# Patient Record
Sex: Female | Born: 1955 | Race: Black or African American | Hispanic: No | Smoking: Current every day smoker
Health system: Southern US, Community
[De-identification: ages and names within clinical notes are randomized; demographics above are authoritative.]

## PROBLEM LIST (undated history)

## (undated) DIAGNOSIS — M1990 Unspecified osteoarthritis, unspecified site: Secondary | ICD-10-CM

## (undated) DIAGNOSIS — T8859XA Other complications of anesthesia, initial encounter: Secondary | ICD-10-CM

## (undated) DIAGNOSIS — I6529 Occlusion and stenosis of unspecified carotid artery: Secondary | ICD-10-CM

## (undated) DIAGNOSIS — T4145XA Adverse effect of unspecified anesthetic, initial encounter: Secondary | ICD-10-CM

## (undated) DIAGNOSIS — R7303 Prediabetes: Secondary | ICD-10-CM

## (undated) DIAGNOSIS — K759 Inflammatory liver disease, unspecified: Secondary | ICD-10-CM

## (undated) DIAGNOSIS — I10 Essential (primary) hypertension: Secondary | ICD-10-CM

## (undated) HISTORY — DX: Occlusion and stenosis of unspecified carotid artery: I65.29

## (undated) HISTORY — PX: ABDOMINAL SURGERY: SHX537

---

## 1983-03-19 DIAGNOSIS — B1920 Unspecified viral hepatitis C without hepatic coma: Secondary | ICD-10-CM | POA: Insufficient documentation

## 1985-03-18 DIAGNOSIS — R159 Full incontinence of feces: Secondary | ICD-10-CM | POA: Insufficient documentation

## 2000-08-21 ENCOUNTER — Emergency Department (HOSPITAL_COMMUNITY): Admission: EM | Admit: 2000-08-21 | Discharge: 2000-08-21 | Payer: Self-pay | Admitting: Emergency Medicine

## 2001-07-01 ENCOUNTER — Emergency Department (HOSPITAL_COMMUNITY): Admission: EM | Admit: 2001-07-01 | Discharge: 2001-07-01 | Payer: Self-pay | Admitting: Internal Medicine

## 2004-02-05 ENCOUNTER — Emergency Department (HOSPITAL_COMMUNITY): Admission: EM | Admit: 2004-02-05 | Discharge: 2004-02-05 | Payer: Self-pay | Admitting: Emergency Medicine

## 2005-11-12 ENCOUNTER — Emergency Department (HOSPITAL_COMMUNITY): Admission: EM | Admit: 2005-11-12 | Discharge: 2005-11-12 | Payer: Self-pay | Admitting: Emergency Medicine

## 2007-01-10 ENCOUNTER — Emergency Department (HOSPITAL_COMMUNITY): Admission: EM | Admit: 2007-01-10 | Discharge: 2007-01-10 | Payer: Self-pay | Admitting: Emergency Medicine

## 2007-07-17 ENCOUNTER — Encounter: Payer: Self-pay | Admitting: Orthopedic Surgery

## 2007-07-17 ENCOUNTER — Emergency Department (HOSPITAL_COMMUNITY): Admission: EM | Admit: 2007-07-17 | Discharge: 2007-07-17 | Payer: Self-pay | Admitting: Emergency Medicine

## 2007-07-22 ENCOUNTER — Ambulatory Visit: Payer: Self-pay | Admitting: Orthopedic Surgery

## 2007-07-22 DIAGNOSIS — S92253B Displaced fracture of navicular [scaphoid] of unspecified foot, initial encounter for open fracture: Secondary | ICD-10-CM

## 2007-07-22 DIAGNOSIS — S92253A Displaced fracture of navicular [scaphoid] of unspecified foot, initial encounter for closed fracture: Secondary | ICD-10-CM | POA: Insufficient documentation

## 2007-07-26 ENCOUNTER — Emergency Department (HOSPITAL_COMMUNITY): Admission: EM | Admit: 2007-07-26 | Discharge: 2007-07-26 | Payer: Self-pay | Admitting: Emergency Medicine

## 2007-08-14 ENCOUNTER — Encounter: Payer: Self-pay | Admitting: Orthopedic Surgery

## 2007-08-24 ENCOUNTER — Ambulatory Visit: Payer: Self-pay | Admitting: Orthopedic Surgery

## 2007-12-03 ENCOUNTER — Emergency Department (HOSPITAL_COMMUNITY): Admission: EM | Admit: 2007-12-03 | Discharge: 2007-12-03 | Payer: Self-pay | Admitting: Emergency Medicine

## 2008-12-24 ENCOUNTER — Emergency Department (HOSPITAL_COMMUNITY): Admission: EM | Admit: 2008-12-24 | Discharge: 2008-12-24 | Payer: Self-pay | Admitting: Emergency Medicine

## 2009-06-10 ENCOUNTER — Emergency Department (HOSPITAL_COMMUNITY): Admission: EM | Admit: 2009-06-10 | Discharge: 2009-06-10 | Payer: Self-pay | Admitting: Emergency Medicine

## 2011-11-30 ENCOUNTER — Emergency Department (HOSPITAL_COMMUNITY): Payer: Self-pay

## 2011-11-30 ENCOUNTER — Encounter (HOSPITAL_COMMUNITY): Payer: Self-pay | Admitting: Emergency Medicine

## 2011-11-30 ENCOUNTER — Emergency Department (HOSPITAL_COMMUNITY)
Admission: EM | Admit: 2011-11-30 | Discharge: 2011-11-30 | Disposition: A | Discharge status: Home / Self Care | Payer: Self-pay | Attending: Emergency Medicine | Admitting: Emergency Medicine

## 2011-11-30 DIAGNOSIS — Z8673 Personal history of transient ischemic attack (TIA), and cerebral infarction without residual deficits: Secondary | ICD-10-CM | POA: Insufficient documentation

## 2011-11-30 DIAGNOSIS — I10 Essential (primary) hypertension: Secondary | ICD-10-CM

## 2011-11-30 DIAGNOSIS — S92309A Fracture of unspecified metatarsal bone(s), unspecified foot, initial encounter for closed fracture: Secondary | ICD-10-CM

## 2011-11-30 DIAGNOSIS — F17200 Nicotine dependence, unspecified, uncomplicated: Secondary | ICD-10-CM | POA: Insufficient documentation

## 2011-11-30 DIAGNOSIS — M79609 Pain in unspecified limb: Secondary | ICD-10-CM | POA: Insufficient documentation

## 2011-11-30 MED ORDER — OXYCODONE-ACETAMINOPHEN 5-325 MG PO TABS: 1.0000 | ORAL_TABLET | ORAL | Status: AC | PRN

## 2011-11-30 NOTE — ED Notes (Signed)
Pt stepped out of shoe this am and has had pain to L foot since. Anterior distal swelling noted. Nad. Has been ambulatory

## 2011-11-30 NOTE — ED Notes (Signed)
While attempting to re-take v/s, patient became very tearful.  Patient stated "alot had happen since April 2011".

## 2011-11-30 NOTE — ED Provider Notes (Signed)
History     CSN: [id number]  Arrival date & time 11/30/11  1335   First MD Initiated Contact with Patient 11/30/11 1353      Chief Complaint  Patient presents with  . Foot Pain    Patient is a 57 y.o. female presenting with lower extremity pain. The history is provided by the patient.  Foot Pain This is a new problem. Episode onset: earlier this morning. The problem occurs constantly. The problem has been gradually worsening. Pertinent negatives include no chest pain. The symptoms are aggravated by walking. The symptoms are relieved by rest. She has tried rest for the symptoms. The treatment provided mild relief.  pt reports she "stepped out of shoe" while dancing and hurt her foot.  No other injury is reported  Past Medical History  Diagnosis Date  . Stroke     Past Surgical History  Procedure Date  . Cesarean section   . Abdominal surgery     History reviewed. No pertinent family history.  History  Substance Use Topics  . Smoking status: Current Every Day Smoker  . Smokeless tobacco: Not on file  . Alcohol Use:      all alcohol daily    OB History    Grav Para Term Preterm Abortions TAB SAB Ect Mult Living                  Review of Systems  Cardiovascular: Negative for chest pain.  Neurological: Negative for weakness.    Allergies  Review of patient's allergies indicates no known allergies.  Home Medications  No current outpatient prescriptions on file.  BP 154/112  Pulse 104  Temp 98.6 F (37 C) (Oral)  Resp 18  Ht 5' 8.5" (1.74 m)  Wt 145 lb (65.772 kg)  BMI 21.73 kg/m2  SpO2 98%  Physical Exam CONSTITUTIONAL: Well developed/well nourished HEAD AND FACE: Normocephalic/atraumatic EYES: EOMI/PERRL ENMT: Mucous membranes moist NECK: supple no meningeal signs SPINE:entire spine nontender CV: S1/S2 noted, no murmurs/rubs/gallops noted LUNGS: Lungs are clear to auscultation bilaterally, no apparent distress ABDOMEN: soft, nontender, no rebound  or guarding GU:no cva tenderness NEURO: Pt is awake/alert, moves all extremitiesx4 EXTREMITIES: pulses normal, full ROM.  Tenderness to dorsal surface of left foot but no deformity or bruising noted.  No lacerations or puncture wounds noted to foot/toes.  She has full ROM of left ankle/knee and no tenderness noted SKIN: warm, color normal PSYCH: anxious ED Course  Procedures   3:00 PM Pt with nondisplaced fractures.  D/w dr Hilda Lias advise postop shoe and see early next week Advised need for f/u with PCP for BP control.  She has no other complaints at this time  MDM  Nursing notes including past medical history and social history reviewed and considered in documentation xrays reviewed and considered         Joya Gaskins, MD 11/30/11 1502

## 2014-12-28 ENCOUNTER — Inpatient Hospital Stay (HOSPITAL_COMMUNITY)
Admission: EM | Admit: 2014-12-28 | Discharge: 2014-12-31 | DRG: 872 | Disposition: A | Discharge status: Home / Self Care | Payer: Non-veteran care | Attending: Internal Medicine | Admitting: Internal Medicine

## 2014-12-28 ENCOUNTER — Other Ambulatory Visit: Payer: Self-pay

## 2014-12-28 ENCOUNTER — Other Ambulatory Visit: Payer: Self-pay | Admitting: Emergency Medicine

## 2014-12-28 ENCOUNTER — Emergency Department (HOSPITAL_COMMUNITY): Payer: Non-veteran care

## 2014-12-28 ENCOUNTER — Encounter (HOSPITAL_COMMUNITY): Payer: Self-pay | Admitting: *Deleted

## 2014-12-28 DIAGNOSIS — Z720 Tobacco use: Secondary | ICD-10-CM | POA: Diagnosis present

## 2014-12-28 DIAGNOSIS — F1010 Alcohol abuse, uncomplicated: Secondary | ICD-10-CM | POA: Diagnosis present

## 2014-12-28 DIAGNOSIS — J189 Pneumonia, unspecified organism: Secondary | ICD-10-CM

## 2014-12-28 DIAGNOSIS — E871 Hypo-osmolality and hyponatremia: Secondary | ICD-10-CM

## 2014-12-28 DIAGNOSIS — R7881 Bacteremia: Secondary | ICD-10-CM | POA: Diagnosis not present

## 2014-12-28 DIAGNOSIS — D509 Iron deficiency anemia, unspecified: Secondary | ICD-10-CM | POA: Diagnosis present

## 2014-12-28 DIAGNOSIS — R531 Weakness: Secondary | ICD-10-CM | POA: Diagnosis not present

## 2014-12-28 DIAGNOSIS — D539 Nutritional anemia, unspecified: Secondary | ICD-10-CM | POA: Diagnosis present

## 2014-12-28 DIAGNOSIS — A4151 Sepsis due to Escherichia coli [E. coli]: Secondary | ICD-10-CM | POA: Diagnosis not present

## 2014-12-28 DIAGNOSIS — F17200 Nicotine dependence, unspecified, uncomplicated: Secondary | ICD-10-CM | POA: Diagnosis present

## 2014-12-28 DIAGNOSIS — N12 Tubulo-interstitial nephritis, not specified as acute or chronic: Secondary | ICD-10-CM | POA: Diagnosis not present

## 2014-12-28 DIAGNOSIS — Z8673 Personal history of transient ischemic attack (TIA), and cerebral infarction without residual deficits: Secondary | ICD-10-CM

## 2014-12-28 DIAGNOSIS — E872 Acidosis: Secondary | ICD-10-CM | POA: Diagnosis present

## 2014-12-28 DIAGNOSIS — E876 Hypokalemia: Secondary | ICD-10-CM | POA: Diagnosis present

## 2014-12-28 DIAGNOSIS — E860 Dehydration: Secondary | ICD-10-CM | POA: Diagnosis present

## 2014-12-28 DIAGNOSIS — I10 Essential (primary) hypertension: Secondary | ICD-10-CM | POA: Diagnosis present

## 2014-12-28 DIAGNOSIS — A599 Trichomoniasis, unspecified: Secondary | ICD-10-CM | POA: Diagnosis present

## 2014-12-28 DIAGNOSIS — B9689 Other specified bacterial agents as the cause of diseases classified elsewhere: Secondary | ICD-10-CM | POA: Diagnosis present

## 2014-12-28 DIAGNOSIS — N10 Acute tubulo-interstitial nephritis: Secondary | ICD-10-CM | POA: Diagnosis present

## 2014-12-28 DIAGNOSIS — Z8249 Family history of ischemic heart disease and other diseases of the circulatory system: Secondary | ICD-10-CM

## 2014-12-28 DIAGNOSIS — B9620 Unspecified Escherichia coli [E. coli] as the cause of diseases classified elsewhere: Secondary | ICD-10-CM | POA: Diagnosis present

## 2014-12-28 LAB — CBC WITH DIFFERENTIAL/PLATELET
BASOS ABS: 0 10*3/uL (ref 0.0–0.1)
BASOS PCT: 0 %
EOS ABS: 0 10*3/uL (ref 0.0–0.7)
Eosinophils Relative: 0 %
HCT: 32.1 % — ABNORMAL LOW (ref 36.0–46.0)
HEMOGLOBIN: 11.5 g/dL — AB (ref 12.0–15.0)
Lymphocytes Relative: 3 %
Lymphs Abs: 0.8 10*3/uL (ref 0.7–4.0)
MCH: 36.4 pg — ABNORMAL HIGH (ref 26.0–34.0)
MCHC: 35.8 g/dL (ref 30.0–36.0)
MCV: 101.6 fL — ABNORMAL HIGH (ref 78.0–100.0)
Monocytes Absolute: 1.5 10*3/uL — ABNORMAL HIGH (ref 0.1–1.0)
Monocytes Relative: 7 %
NEUTROS PCT: 90 %
Neutro Abs: 21 10*3/uL — ABNORMAL HIGH (ref 1.7–7.7)
Platelets: 191 10*3/uL (ref 150–400)
RBC: 3.16 MIL/uL — AB (ref 3.87–5.11)
RDW: 13 % (ref 11.5–15.5)
WBC: 23.2 10*3/uL — AB (ref 4.0–10.5)

## 2014-12-28 LAB — FOLATE: Folate: 18.5 ng/mL (ref >5.9)

## 2014-12-28 LAB — URINALYSIS, ROUTINE W REFLEX MICROSCOPIC
BILIRUBIN URINE: NEGATIVE
GLUCOSE, UA: NEGATIVE mg/dL
Ketones, ur: NEGATIVE mg/dL
NITRITE: POSITIVE — AB
PH: 6.5 (ref 5.0–8.0)
SPECIFIC GRAVITY, URINE: 1.015 (ref 1.005–1.030)
Urobilinogen, UA: 0.2 mg/dL (ref 0.0–1.0)

## 2014-12-28 LAB — COMPREHENSIVE METABOLIC PANEL
ALK PHOS: 87 U/L (ref 38–126)
ALT: 19 U/L (ref 14–54)
AST: 26 U/L (ref 15–41)
Albumin: 2.9 g/dL — ABNORMAL LOW (ref 3.5–5.0)
Anion gap: 7 (ref 5–15)
BUN: 13 mg/dL (ref 6–20)
CALCIUM: 8.1 mg/dL — AB (ref 8.9–10.3)
CHLORIDE: 103 mmol/L (ref 101–111)
CO2: 20 mmol/L — ABNORMAL LOW (ref 22–32)
CREATININE: 0.8 mg/dL (ref 0.44–1.00)
Glucose, Bld: 126 mg/dL — ABNORMAL HIGH (ref 65–99)
Potassium: 2.9 mmol/L — ABNORMAL LOW (ref 3.5–5.1)
Sodium: 130 mmol/L — ABNORMAL LOW (ref 135–145)
TOTAL PROTEIN: 6.4 g/dL — AB (ref 6.5–8.1)
Total Bilirubin: 0.6 mg/dL (ref 0.3–1.2)

## 2014-12-28 LAB — RETICULOCYTES
RBC.: 3.21 MIL/uL — ABNORMAL LOW (ref 3.87–5.11)
RETIC COUNT ABSOLUTE: 61 10*3/uL (ref 19.0–186.0)
Retic Ct Pct: 1.9 % (ref 0.4–3.1)

## 2014-12-28 LAB — IRON AND TIBC
IRON: 6 ug/dL — AB (ref 28–170)
SATURATION RATIOS: 2 % — AB (ref 10.4–31.8)
TIBC: 244 ug/dL — ABNORMAL LOW (ref 250–450)
UIBC: 238 ug/dL

## 2014-12-28 LAB — VITAMIN B12: Vitamin B-12: 526 pg/mL (ref 180–914)

## 2014-12-28 LAB — URINE MICROSCOPIC-ADD ON

## 2014-12-28 LAB — MAGNESIUM: MAGNESIUM: 1.7 mg/dL (ref 1.7–2.4)

## 2014-12-28 LAB — FERRITIN: FERRITIN: 475 ng/mL — AB (ref 11–307)

## 2014-12-28 LAB — TSH: TSH: 0.296 u[IU]/mL — ABNORMAL LOW (ref 0.350–4.500)

## 2014-12-28 LAB — LACTIC ACID, PLASMA: LACTIC ACID, VENOUS: 1.3 mmol/L (ref 0.5–2.0)

## 2014-12-28 MED ORDER — SODIUM CHLORIDE 0.9 % IV BOLUS (SEPSIS)
1000.0000 mL | Freq: Once | INTRAVENOUS | Status: AC
  Administered 2014-12-28: 1000 mL via INTRAVENOUS

## 2014-12-28 MED ORDER — FOLIC ACID 1 MG PO TABS
1.0000 mg | ORAL_TABLET | Freq: Every day | ORAL | Status: DC
  Administered 2014-12-28 – 2014-12-31 (×4): 1 mg via ORAL
  Filled 2014-12-28 (×4): qty 1

## 2014-12-28 MED ORDER — ONDANSETRON HCL 4 MG PO TABS: 4.0000 mg | ORAL_TABLET | Freq: Four times a day (QID) | ORAL | Status: DC | PRN

## 2014-12-28 MED ORDER — IOHEXOL 300 MG/ML  SOLN
50.0000 mL | Freq: Once | INTRAMUSCULAR | Status: AC | PRN
  Administered 2014-12-28: 50 mL via ORAL

## 2014-12-28 MED ORDER — LISINOPRIL 10 MG PO TABS
10.0000 mg | ORAL_TABLET | Freq: Every day | ORAL | Status: DC
  Administered 2014-12-28 – 2014-12-29 (×2): 10 mg via ORAL
  Filled 2014-12-28 (×2): qty 1

## 2014-12-28 MED ORDER — SODIUM CHLORIDE 0.9 % IV SOLN
Freq: Once | INTRAVENOUS | Status: AC
  Administered 2014-12-28: 06:00:00 via INTRAVENOUS

## 2014-12-28 MED ORDER — SENNOSIDES-DOCUSATE SODIUM 8.6-50 MG PO TABS
1.0000 | ORAL_TABLET | Freq: Every evening | ORAL | Status: DC | PRN
  Filled 2014-12-28: qty 1

## 2014-12-28 MED ORDER — POTASSIUM CHLORIDE IN NACL 20-0.9 MEQ/L-% IV SOLN
INTRAVENOUS | Status: DC
  Administered 2014-12-28 – 2014-12-29 (×2): via INTRAVENOUS

## 2014-12-28 MED ORDER — ONDANSETRON HCL 4 MG/2ML IJ SOLN
4.0000 mg | Freq: Once | INTRAMUSCULAR | Status: AC
  Administered 2014-12-28: 4 mg via INTRAVENOUS

## 2014-12-28 MED ORDER — ACETAMINOPHEN 325 MG PO TABS
650.0000 mg | ORAL_TABLET | Freq: Four times a day (QID) | ORAL | Status: DC | PRN
Start: 2014-12-28 — End: 2014-12-31
  Administered 2014-12-28 – 2014-12-30 (×4): 650 mg via ORAL
  Filled 2014-12-28 (×4): qty 2

## 2014-12-28 MED ORDER — THIAMINE HCL 100 MG/ML IJ SOLN: 100.0000 mg | Freq: Every day | INTRAMUSCULAR | Status: DC

## 2014-12-28 MED ORDER — POTASSIUM CHLORIDE CRYS ER 20 MEQ PO TBCR
30.0000 meq | EXTENDED_RELEASE_TABLET | Freq: Three times a day (TID) | ORAL | Status: AC
  Administered 2014-12-28 – 2014-12-29 (×3): 30 meq via ORAL
  Filled 2014-12-28 (×6): qty 1

## 2014-12-28 MED ORDER — IBUPROFEN 600 MG PO TABS
600.0000 mg | ORAL_TABLET | Freq: Three times a day (TID) | ORAL | Status: DC | PRN
  Administered 2014-12-28 – 2014-12-29 (×2): 600 mg via ORAL
  Filled 2014-12-28 (×2): qty 1

## 2014-12-28 MED ORDER — DEXTROSE 5 % IV SOLN: 1.0000 g | INTRAVENOUS | Status: DC

## 2014-12-28 MED ORDER — METRONIDAZOLE 500 MG PO TABS
2000.0000 mg | ORAL_TABLET | Freq: Once | ORAL | Status: AC
  Administered 2014-12-28: 2000 mg via ORAL
  Filled 2014-12-28: qty 4

## 2014-12-28 MED ORDER — VITAMIN B-1 100 MG PO TABS
100.0000 mg | ORAL_TABLET | Freq: Every day | ORAL | Status: DC
  Administered 2014-12-28 – 2014-12-31 (×4): 100 mg via ORAL
  Filled 2014-12-28 (×4): qty 1

## 2014-12-28 MED ORDER — ALUM & MAG HYDROXIDE-SIMETH 200-200-20 MG/5ML PO SUSP: 30.0000 mL | Freq: Four times a day (QID) | ORAL | Status: DC | PRN

## 2014-12-28 MED ORDER — ADULT MULTIVITAMIN W/MINERALS CH
1.0000 | ORAL_TABLET | Freq: Every day | ORAL | Status: DC
  Administered 2014-12-28 – 2014-12-31 (×4): 1 via ORAL
  Filled 2014-12-28 (×4): qty 1

## 2014-12-28 MED ORDER — IOHEXOL 300 MG/ML  SOLN
100.0000 mL | Freq: Once | INTRAMUSCULAR | Status: DC | PRN
  Administered 2014-12-28: 100 mL via INTRAVENOUS
  Filled 2014-12-28: qty 100

## 2014-12-28 MED ORDER — LORAZEPAM 2 MG/ML IJ SOLN: 1.0000 mg | Freq: Four times a day (QID) | INTRAMUSCULAR | Status: AC | PRN

## 2014-12-28 MED ORDER — THIAMINE HCL 100 MG/ML IJ SOLN
100.0000 mg | Freq: Once | INTRAMUSCULAR | Status: AC
  Administered 2014-12-28: 100 mg via INTRAVENOUS

## 2014-12-28 MED ORDER — DEXTROSE 5 % IV SOLN
1.0000 g | INTRAVENOUS | Status: DC
  Filled 2014-12-28: qty 10

## 2014-12-28 MED ORDER — POTASSIUM CHLORIDE IN NACL 40-0.9 MEQ/L-% IV SOLN
INTRAVENOUS | Status: DC
  Administered 2014-12-28: 125 mL/h via INTRAVENOUS
  Filled 2014-12-28: qty 1000

## 2014-12-28 MED ORDER — ACETAMINOPHEN 650 MG RE SUPP
650.0000 mg | Freq: Four times a day (QID) | RECTAL | Status: DC | PRN
Start: 2014-12-28 — End: 2014-12-31

## 2014-12-28 MED ORDER — LORAZEPAM 1 MG PO TABS
1.0000 mg | ORAL_TABLET | Freq: Four times a day (QID) | ORAL | Status: AC | PRN
  Administered 2014-12-29 – 2014-12-30 (×2): 1 mg via ORAL
  Filled 2014-12-28 (×2): qty 1

## 2014-12-28 MED ORDER — HYDROCHLOROTHIAZIDE 25 MG PO TABS
25.0000 mg | ORAL_TABLET | Freq: Every day | ORAL | Status: DC
  Administered 2014-12-28: 25 mg via ORAL
  Filled 2014-12-28: qty 1

## 2014-12-28 MED ORDER — DEXTROSE 5 % IV SOLN
2.0000 g | Freq: Once | INTRAVENOUS | Status: AC
  Administered 2014-12-28: 2 g via INTRAVENOUS
  Filled 2014-12-28: qty 2

## 2014-12-28 MED ORDER — ONDANSETRON HCL 4 MG/2ML IJ SOLN: 4.0000 mg | Freq: Once | INTRAMUSCULAR | Status: DC

## 2014-12-28 MED ORDER — AMLODIPINE BESYLATE 5 MG PO TABS
10.0000 mg | ORAL_TABLET | Freq: Every day | ORAL | Status: DC
  Administered 2014-12-28 – 2014-12-29 (×2): 10 mg via ORAL
  Filled 2014-12-28 (×2): qty 2

## 2014-12-28 MED ORDER — ENOXAPARIN SODIUM 40 MG/0.4ML ~~LOC~~ SOLN
40.0000 mg | SUBCUTANEOUS | Status: DC
  Filled 2014-12-28: qty 0.4

## 2014-12-28 MED ORDER — SODIUM CHLORIDE 0.9 % IV BOLUS (SEPSIS)
2000.0000 mL | Freq: Once | INTRAVENOUS | Status: AC
  Administered 2014-12-28: 2000 mL via INTRAVENOUS

## 2014-12-28 MED ORDER — CYCLOBENZAPRINE HCL 10 MG PO TABS
5.0000 mg | ORAL_TABLET | Freq: Two times a day (BID) | ORAL | Status: DC | PRN
  Administered 2014-12-29: 5 mg via ORAL
  Filled 2014-12-28 (×2): qty 1

## 2014-12-28 MED ORDER — LEVOFLOXACIN IN D5W 750 MG/150ML IV SOLN
750.0000 mg | INTRAVENOUS | Status: DC
  Administered 2014-12-28 – 2014-12-31 (×4): 750 mg via INTRAVENOUS
  Filled 2014-12-28 (×4): qty 150

## 2014-12-28 MED ORDER — ENOXAPARIN SODIUM 40 MG/0.4ML ~~LOC~~ SOLN
40.0000 mg | SUBCUTANEOUS | Status: DC
  Administered 2014-12-28 – 2014-12-31 (×4): 40 mg via SUBCUTANEOUS
  Filled 2014-12-28 (×3): qty 0.4

## 2014-12-28 MED ORDER — ONDANSETRON HCL 4 MG/2ML IJ SOLN
4.0000 mg | Freq: Four times a day (QID) | INTRAMUSCULAR | Status: DC | PRN
  Administered 2014-12-28: 4 mg via INTRAVENOUS
  Filled 2014-12-28: qty 2

## 2014-12-28 MED ORDER — NICOTINE 21 MG/24HR TD PT24
21.0000 mg | MEDICATED_PATCH | Freq: Every day | TRANSDERMAL | Status: DC
  Administered 2014-12-28 – 2014-12-31 (×4): 21 mg via TRANSDERMAL
  Filled 2014-12-28 (×4): qty 1

## 2014-12-28 NOTE — ED Notes (Signed)
Pt states that she is "slowly feeling better".

## 2014-12-28 NOTE — H&P (Signed)
History and Physical  Paula Gomez ZOX:[id number] DOB: Jul 27, 1955 DOA: 12/28/2014  Referring physician: Dr Wilkie Aye, ED physician PCP: No primary care provider on file.   Chief Complaint: Left flank pain, weakness  HPI: Paula Gomez is a 59 y.o. female  With a history of hypertension who is seen for 48 hours of weakness and left flank pain just been worsening over the past 24 hours. The patient went on a bender this past weekend drinking a pint of vodka each day. She is a chronic alcoholic who drinks 3-4 beers a day. Since Monday, her symptoms of worsening. She presented to the hospital due to weakness that worsened with elevation and movement and improved with lying down in bed. She denies fevers, chills.    Review of Systems:   Pt complains of diminished appetite.  Pt denies any fevers, chills, nausea, vomiting, diarrhea, constipation, abdominal pain, shortness of breath, dyspnea on exertion, orthopnea, cough, wheezing, palpitations, headache, vision changes, lightheadedness, dizziness, diarrhea, constipation, melena, rectal bleeding.  Review of systems are otherwise negative  Past Medical History  Diagnosis Date  . Stroke [facility])    Past Surgical History  Procedure Laterality Date  . Cesarean section    . Abdominal surgery     Social History:  reports that she has been smoking.  She does not have any smokeless tobacco history on file. She reports that she drinks alcohol. She reports that she uses illicit drugs (Marijuana and Cocaine). Patient lives at home & is able to participate in activities of daily living  No Known Allergies  Family history of hypertension  Prior to Admission medications   Medication Sig Start Date End Date Taking? Authorizing Provider  amLODipine (NORVASC) 10 MG tablet Take 10 mg by mouth daily.   Yes Historical Provider, MD  cyclobenzaprine (FLEXERIL) 10 MG tablet Take 5 mg by mouth 2 (two) times daily as needed for muscle spasms.   Yes Historical  Provider, MD  hydrochlorothiazide (HYDRODIURIL) 25 MG tablet Take 25 mg by mouth daily.   Yes Historical Provider, MD  ibuprofen (ADVIL,MOTRIN) 600 MG tablet Take 600 mg by mouth 3 (three) times daily as needed for mild pain.   Yes Historical Provider, MD  lisinopril (PRINIVIL,ZESTRIL) 20 MG tablet Take 10 mg by mouth daily.   Yes Historical Provider, MD    Physical Exam: BP 128/86 mmHg  Pulse 111  Temp(Src) 97.7 F (36.5 C)  Resp 20  SpO2 95%  General: Middle-aged black female. Awake and alert and oriented x3. No acute cardiopulmonary distress.  Eyes: Pupils equal, round, reactive to light. Extraocular muscles are intact. Sclerae anicteric and noninjected.  ENT: Moist mucosal membranes. No mucosal lesions. Teeth in moderate repair  Neck: Neck supple without lymphadenopathy. No carotid bruits. No masses palpated.  Cardiovascular: Regular rate with normal S1-S2 sounds. No murmurs, rubs, gallops auscultated. No JVD.  Respiratory: Good respiratory effort with no wheezes, rales, rhonchi. Lungs clear to auscultation bilaterally.  Abdomen: Soft, nontender, nondistended. Active bowel sounds. No masses or hepatosplenomegaly  Skin: Dry, warm to touch. 2+ dorsalis pedis and radial pulses. Musculoskeletal: No calf or leg pain. All major joints not erythematous nontender. CVA tenderness left greater than right Psychiatric: Intact judgment and insight.  Neurologic: No focal neurological deficits. Cranial nerves II through XII are grossly intact.           Labs on Admission:  Due to downtime, the patient's labs have not been entered into the electronic medical record. Chemistry: Sodium 127, potassium  3.0, chloride 96, I can't 18, BUN 25, creatinine 1.09, glucose 155. LFTs: Alkaline phosphatase 105, AST 27, ALT 23, albumin 3.8, total bili 0.9  Lipase 16  Complete blood count: WBC 26.5, hemoglobin 14.0, hematocrit 38.5, platelets 244 Differential: Neutrophils 87% with absolute granulocyte  23.1  Radiological Exams on Admission: Ct Abdomen Pelvis W Contrast  12/28/2014  CLINICAL DATA:  Left-sided abdominal pain and vomiting. Weakness. Urinary tract infection. EXAM: CT ABDOMEN AND PELVIS WITH CONTRAST TECHNIQUE: Multidetector CT imaging of the abdomen and pelvis was performed using the standard protocol following bolus administration of intravenous contrast. CONTRAST:  50mL OMNIPAQUE IOHEXOL 300 MG/ML SOLN, 100mL OMNIPAQUE IOHEXOL 300 MG/ML SOLN COMPARISON:  CT pelvis 06/10/2009 FINDINGS: Patchy infiltration in the lung bases is nonspecific but could indicate edema or pneumonia. The liver, spleen, gallbladder, pancreas, adrenal glands, inferior vena cava, and retroperitoneal lymph nodes are unremarkable. Calcification of abdominal aorta without aneurysm. 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 Heterogeneous nephrogram to both kidneys but more prominent on the left kidney. Left kidney is diffusely enlarged with a focal a zones of low attenuation. Appearance is consistent with pyelonephritis. No abscess identified. No hydronephrosis. Mild stranding around the left kidney. The stomach, small bowel, and colon appear normal. No free air or free fluid in the abdomen. Abdominal wall musculature appears intact. Pelvis: The appendix is normal. Uterus and ovaries are not enlarged. Surgical clips consistent with tubal ligations. Bladder wall is not thickened. No free or loculated pelvic fluid collections. No pelvic mass or lymphadenopathy. Degenerative changes in the spine. No destructive bone lesions. IMPRESSION: Changes of bilateral pyelonephritis, greater on the left. Patchy airspace disease in the lung bases may indicate edema or pneumonia. Electronically Signed   By: Burman NievesWilliam  Stevens M.D.   On: 12/28/2014 05:13    EKG: Independently reviewed. Sinus tachycardia with ventricular rate of 109. Normal intervals.  Assessment/Plan Present on Admission:  . Pyelonephritis . Hyponatremia .  Hypokalemia  This patient was discussed with the ED physician, including pertinent vitals, physical exam findings, labs, and imaging.  We also discussed care given by the ED provider.  #1 bilateral pyelonephritis  Admits  Given the patient's electrolyte abnormalities, extremely elevated white blood cell count, patient admitted  Continue Rocephin  Urine culture pending  Blood cultures pending  IV fluids normal saline with 40 mEq potassium at 125 mL per hour 2 hyponatremia  IV fluids at 125 animals per hour #3 hypokalemia  Replace potassium  Recheck metabolic panel tomorrow  DVT prophylaxis: Lovenox  Consultants: None  Code Status: Full code  Family Communication: None   Disposition Plan: Admission   Levie HeritageJacob J Stinson, DO [facility] Pager [phone]

## 2014-12-28 NOTE — Progress Notes (Signed)
The patient is a 59 year old with a history of a stroke, alcohol abuse, and tobacco abuse, who was admitted this morning by Dr. Adrian BlackwaterStinson for acute polynephritis. She was briefly seen and examined. Her chart, vital signs, and laboratory studies were reviewed. Agree with current management with changes/additions below:  -We'll discontinue hydrochlorothiazide. -We'll add potassium to the IV fluids. Will order oral potassium for repletion. -We'll discontinue ceftriaxone and start Levaquin to cover both polynephritis and possible pneumonia. (2 g Metronidazole given in the ED for trichomonas in the urine). -We will order a two-view chest x-ray tomorrow morning. -We'll order an anemia panel and HIV serology. -We'll consult social worker regarding alcohol abuse. Will order tobacco cessation counseling. We'll add a nicotine patch.

## 2014-12-28 NOTE — ED Provider Notes (Addendum)
CSN: [id number]     Arrival date & time 12/28/14  0241 History   First MD Initiated Contact with Patient 12/28/14 0249     Chief Complaint  Patient presents with  . Weakness     (Consider location/radiation/quality/duration/timing/severity/associated sxs/prior Treatment) HPI  This a 59 year old female who presents with nausea, vomiting, and weakness. Reports chills. No fevers. Patient reports that she feels like she drank too much over the weekend. She has a history of daily drinking. She has not had anything to drink since Sunday when she drank a pint of vodka. Reports diaphoresis. Reports feeling off balance, no room spinning dizziness. She reports nonbilious, nonbloody emesis. Denies abdominal pain.  Denies urinary symptoms. Does endorse left flank pain.  Past Medical History  Diagnosis Date  . Stroke [facility])    Past Surgical History  Procedure Laterality Date  . Cesarean section    . Abdominal surgery     History reviewed. No pertinent family history. Social History  Substance Use Topics  . Smoking status: Current Every Day Smoker  . Smokeless tobacco: None  . Alcohol Use: Yes     Comment: all alcohol daily   OB History    No data available     Review of Systems  Constitutional: Positive for chills. Negative for fever.  Respiratory: Negative for cough, chest tightness and shortness of breath.   Cardiovascular: Negative for chest pain.  Gastrointestinal: Positive for nausea and vomiting. Negative for abdominal pain.  Genitourinary: Positive for flank pain. Negative for dysuria and hematuria.  Musculoskeletal: Negative for back pain.  Neurological: Positive for dizziness. Negative for headaches.  Psychiatric/Behavioral: Negative for confusion.  All other systems reviewed and are negative.     Allergies  Review of patient's allergies indicates no known allergies.  Home Medications   Prior to Admission medications   Not on File   BP 128/86 mmHg  Pulse 111   Temp(Src) 97.7 F (36.5 C)  Resp 20  SpO2 95% Physical Exam  Constitutional: She is oriented to person, place, and time. No distress.  Ill-appearing but nontoxic, no acute distress  HENT:  Head: Normocephalic and atraumatic.  Mucous membranes dry  Cardiovascular: Regular rhythm and normal heart sounds.   No murmur heard. Tachycardia  Pulmonary/Chest: Effort normal and breath sounds normal. No respiratory distress. She has no wheezes.  Abdominal: Soft. Bowel sounds are normal. There is no tenderness. There is no rebound.  Genitourinary:  Left CVA tenderness  Neurological: She is alert and oriented to person, place, and time.  Cranial nerves II through XII intact, 5 out of 5 strength in all 4 extremities, no dysmetria to finger-nose-finger  Skin: Skin is warm and dry.  Psychiatric: She has a normal mood and affect.  Nursing note and vitals reviewed.   ED Course  Procedures (including critical care time) Labs Review Labs Reviewed  URINALYSIS, ROUTINE W REFLEX MICROSCOPIC (NOT AT [facility]) - Abnormal; Notable for the following:    Hgb urine dipstick TRACE (*)    Protein, ur TRACE (*)    Nitrite POSITIVE (*)    Leukocytes, UA SMALL (*)    All other components within normal limits  URINE MICROSCOPIC-ADD ON - Abnormal; Notable for the following:    Squamous Epithelial / LPF FEW (*)    Bacteria, UA MANY (*)    All other components within normal limits  CULTURE, BLOOD (ROUTINE X 2)  CULTURE, BLOOD (ROUTINE X 2)  URINE CULTURE  CBC WITH DIFFERENTIAL/PLATELET  LACTIC ACID, PLASMA  LACTIC ACID, PLASMA    Imaging Review Ct Abdomen Pelvis W Contrast  12/28/2014  CLINICAL DATA:  Left-sided abdominal pain and vomiting. Weakness. Urinary tract infection. EXAM: CT ABDOMEN AND PELVIS WITH CONTRAST TECHNIQUE: Multidetector CT imaging of the abdomen and pelvis was performed using the standard protocol following bolus administration of intravenous contrast. CONTRAST:  50mL OMNIPAQUE IOHEXOL  300 MG/ML SOLN, 100mL OMNIPAQUE IOHEXOL 300 MG/ML SOLN COMPARISON:  CT pelvis 06/10/2009 FINDINGS: Patchy infiltration in the lung bases is nonspecific but could indicate edema or pneumonia. The liver, spleen, gallbladder, pancreas, adrenal glands, inferior vena cava, and retroperitoneal lymph nodes are unremarkable. Calcification of abdominal aorta without aneurysm. 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 Heterogeneous nephrogram to both kidneys but more prominent on the left kidney. Left kidney is diffusely enlarged with a focal a zones of low attenuation. Appearance is consistent with pyelonephritis. No abscess identified. No hydronephrosis. Mild stranding around the left kidney. The stomach, small bowel, and colon appear normal. No free air or free fluid in the abdomen. Abdominal wall musculature appears intact. Pelvis: The appendix is normal. Uterus and ovaries are not enlarged. Surgical clips consistent with tubal ligations. Bladder wall is not thickened. No free or loculated pelvic fluid collections. No pelvic mass or lymphadenopathy. Degenerative changes in the spine. No destructive bone lesions. IMPRESSION: Changes of bilateral pyelonephritis, greater on the left. Patchy airspace disease in the lung bases may indicate edema or pneumonia. Electronically Signed   By: Burman NievesWilliam  Stevens M.D.   On: 12/28/2014 05:13   I have personally reviewed and evaluated these images and lab results as part of my medical decision-making.   EKG Interpretation ED ECG REPORT   Date: 12/28/2014  Rate: 109  Rhythm: sinus tachycardia  QRS Axis: normal  Intervals: normal  ST/T Wave abnormalities: normal  Conduction Disutrbances:none  Narrative Interpretation:          MDM   Final diagnoses:  Pyelonephritis  Hyponatremia    Patient presents with nausea, vomiting, dizziness, and weakness. Feels this is related to alcohol use. Ill-appearing but nontoxic on exam. Mucous membranes dry and patient  is tachycardic.  Patient given fluids. She is afebrile. Lab work obtained including urinalysis. Lab work notable for white blood count of 26, sodium of 127. Urine is nitrite positive with 7-10 white cells and many bacteria. Trichomonas is also present. Urine culture sent. Blood culture sent and patient given 2 g of Rocephin. She was also given 2 g of Flagyl for trichomonas. Given systemic symptoms and chills as well as left flank pain, would be concerned for pyelonephritis. CT obtained to rule out perinephric abscess. CT with evidence of bilateral polynephritis, left greater than right. On recheck, has received 2 L of fluid. She reports that she feels much better. She has no active emesis. Given evidence of bilateral pyelonephritis, leukocytosis, and hyponatremia, will admit for IV antibiotics and continued fluid administered.  Discussed with Dr. Adrian BlackwaterStinson.     Shon Batonourtney F Horton, MD 12/28/14 [id number]  Shon Batonourtney F Horton, MD 12/28/14 0600

## 2014-12-28 NOTE — Progress Notes (Signed)
Lab called with blood culture results showing gram neg rods, MD made aware.

## 2014-12-28 NOTE — ED Notes (Signed)
Pt ambulated to BR with minimal assistance.  Reporting that she is feeling some better.  No nausea or vomiting noted at present.

## 2014-12-28 NOTE — ED Notes (Signed)
Pt reporting weakness, and vomiting that began about 2 hours ago.  Pt states "i had too much to drink over the weekend."  Denies ETOH since Sunday.

## 2014-12-29 ENCOUNTER — Inpatient Hospital Stay (HOSPITAL_COMMUNITY): Payer: Non-veteran care

## 2014-12-29 DIAGNOSIS — Z720 Tobacco use: Secondary | ICD-10-CM

## 2014-12-29 LAB — CBC
HCT: 29.6 % — ABNORMAL LOW (ref 36.0–46.0)
HEMOGLOBIN: 10.6 g/dL — AB (ref 12.0–15.0)
MCH: 35.9 pg — ABNORMAL HIGH (ref 26.0–34.0)
MCHC: 35.8 g/dL (ref 30.0–36.0)
MCV: 100.3 fL — ABNORMAL HIGH (ref 78.0–100.0)
PLATELETS: 179 10*3/uL (ref 150–400)
RBC: 2.95 MIL/uL — AB (ref 3.87–5.11)
RDW: 12.9 % (ref 11.5–15.5)
WBC: 15.4 10*3/uL — ABNORMAL HIGH (ref 4.0–10.5)

## 2014-12-29 LAB — BASIC METABOLIC PANEL
Anion gap: 6 (ref 5–15)
BUN: 9 mg/dL (ref 6–20)
CHLORIDE: 108 mmol/L (ref 101–111)
CO2: 18 mmol/L — ABNORMAL LOW (ref 22–32)
CREATININE: 0.65 mg/dL (ref 0.44–1.00)
Calcium: 8.3 mg/dL — ABNORMAL LOW (ref 8.9–10.3)
Glucose, Bld: 104 mg/dL — ABNORMAL HIGH (ref 65–99)
POTASSIUM: 3.5 mmol/L (ref 3.5–5.1)
SODIUM: 132 mmol/L — AB (ref 135–145)

## 2014-12-29 LAB — HIV ANTIBODY (ROUTINE TESTING W REFLEX): HIV SCREEN 4TH GENERATION: NONREACTIVE

## 2014-12-29 MED ORDER — POTASSIUM CHLORIDE CRYS ER 20 MEQ PO TBCR
20.0000 meq | EXTENDED_RELEASE_TABLET | Freq: Two times a day (BID) | ORAL | Status: DC
  Administered 2014-12-29 – 2014-12-31 (×4): 20 meq via ORAL
  Filled 2014-12-29 (×4): qty 1

## 2014-12-29 MED ORDER — LISINOPRIL 5 MG PO TABS
5.0000 mg | ORAL_TABLET | Freq: Every day | ORAL | Status: DC
  Administered 2014-12-30 – 2014-12-31 (×2): 5 mg via ORAL
  Filled 2014-12-29 (×2): qty 1

## 2014-12-29 MED ORDER — AMLODIPINE BESYLATE 5 MG PO TABS
5.0000 mg | ORAL_TABLET | Freq: Every day | ORAL | Status: DC
  Administered 2014-12-30 – 2014-12-31 (×2): 5 mg via ORAL
  Filled 2014-12-29 (×2): qty 1

## 2014-12-29 NOTE — Clinical Social Work Note (Signed)
Clinical Social Work Assessment  Patient Details  Name: Paula Gomez MRN: [id number] Date of Birth: 01/25/1956  Date of referral:  12/29/14               Reason for consult:  Substance Use/ETOH Abuse                Permission sought to share information with:    Permission granted to share information::     Name::        Agency::     Relationship::     Contact Information:     Housing/Transportation Living arrangements for the past 2 months:  Single Family Home Source of Information:  Patient Patient Interpreter Needed:  None Criminal Activity/Legal Involvement Pertinent to Current Situation/Hospitalization:  No - Comment as needed Significant Relationships:  Siblings Lives with:  Self Do you feel safe going back to the place where you live?  Yes Need for family participation in patient care:  No (Coment)  Care giving concerns:  No concerns.    Social Worker assessment / plan:  CSW met with pt at bedside. Pt alert and oriented and agrees for CSW to complete assessment with her friend present. Pt states she lives alone. Her best support is her sister, Delcie Roch. Pt reports she is a disabled veteran.   CSW addressed substance use with pt. She admits to using marijuana "Every chance I can get." Pt drinks alcohol daily and reports, "I drink until I'm ready to stop or the alcohol runs out." She said she drank a lot over the weekend, but has not had anything since Sunday. On average, pt drinks 4-5 beers daily and a pint of liquor several times a week. Pt admits that drinking has been a problem for her. She has been to inpatient treatment several times in the past. Pt was able to maintain sobriety for 2 years in 2000 after treatment. Pt's sisters are concerned about her substance use. Pt states she is already working with the [facility] to get back into inpatient treatment and has appointment coming up soon to discuss this further. She requests to continue with this plan. Other resources provided.  SBIRT completed. CSW will sign off, but can be reconsulted if needed.   Employment status:  Disabled (Comment on whether or not currently receiving Disability) Insurance information:  [employer] Benefit PT Recommendations:  Not assessed at this time Information / Referral to community resources:  Outpatient Substance Abuse Treatment Options  Patient/Family's Response to care:  Pt very open to talking with CSW and states she already has a plan in place for treatment.   Patient/Family's Understanding of and Emotional Response to Diagnosis, Current Treatment, and Prognosis:  Pt aware of admission diagnosis and treatment plan. She admits to feelings of guilt regarding her substance use recently. Pt is ready to move forward with plan for inpatient treatment through the [place]. She reports she is very hopeful to d/c tomorrow back home.   Emotional Assessment Appearance:  Appears stated age Attitude/Demeanor/Rapport:  Other (pleasant) Affect (typically observed):  Appropriate Orientation:  Oriented to Self, Oriented to Place, Oriented to  Time, Oriented to Situation Alcohol / Substance use:  Illicit Drugs, Alcohol Use Psych involvement (Current and /or in the community):  No (Comment)  Discharge Needs  Concerns to be addressed:  Substance Abuse Concerns Readmission within the last 30 days:  No Current discharge risk:  Substance Abuse Barriers to Discharge:  Continued Medical Work up   [employer], [employer], LCSW 12/29/2014, 11:06  AM [phone]

## 2014-12-29 NOTE — Care Management Note (Signed)
Case Management Note  Patient Details  Name: Paula Gomez MRN: [id number] Date of Birth: [birth year]  Subjective/Objective:                  Pt admitted from home with pyelonephritis. Pt lives alone and will return home at discharge. Pt is independent with ADL's. [facility]   Action[facility]Plan[facility]ts [facility]. No further CM needs noted. Anticipate discharge within 48 hours.  Expected Discharge Date:  12/29/14               Expected Discharge Plan:  Home/Self Care  In-House Referral:  NA  Discharge planning Services  CM Consult  Post Acute Care Choice:  NA Choice offered to:  NA  DME Arranged:    DME Agency:     HH Arranged:    HH Agency:     Status of Service:  Completed, signed off  Medicare Important Message Given:    Date Medicare IM Given:    Medicare IM give by:    Date Additional Medicare IM Given:    Additional Medicare Important Message give by:     If discussed at Long Length of Stay Meetings, dates discussed:    Additional Comments:  Cheryl FlashBlackwell, Hiroto Saltzman Crowder, RN 12/29/2014, 12:25 PM

## 2014-12-29 NOTE — Progress Notes (Signed)
[facility] PROGRESS NOTE  Paula HartshornBernice W Privott ZOX:[id number] DOB: April 02, 1955 DOA: 12/28/2014 PCP: No primary care provider on file.    Code Status: Full code Family Communication: Discussed with sisters with permission on 12/28/14. Disposition Plan: Discharge when clinically appropriate, possibly in the next 48 hours.   Consultants:  None  Procedures:  None  Antibiotics:  Levaquin 12/28/14>>  Rocephin 12/28/14>> 12/28/14  Metronidazole 2 g times one given in ED.  HPI/Subjective: Patient says that she feels better. She has no pain with urination or flank pain. She denies cough or purulent drainage.  Objective: Filed Vitals:   12/29/14 0633  BP: 106/62  Pulse: 108  Temp: 97.8 F (36.6 C)  Resp: 18   oxygen saturation 98% on room air  Intake/Output Summary (Last 24 hours) at 12/29/14 1052 Last data filed at 12/29/14 0851  Gross per 24 hour  Intake   2225 ml  Output      0 ml  Net   2225 ml   Filed Weights   12/28/14 0744  Weight: 58.202 kg (128 lb 5 oz)    Exam:   General:  Pleasant 59 year old African-American woman in no acute distress.  Cardiovascular: S1, S2, with mild tachycardia.  Respiratory: Decreased breath sounds in the bases, otherwise clear. Breathing is nonlabored.  Abdomen: Positive bowel sounds, soft, nontender, nondistended.  Musculoskeletal/extremities: Pedal pulses palpable. No pedal edema. No acute hot red joints.  Neurologic/psychiatric: The patient is alert and oriented 3. Cranial nerves II through XII are intact. She has a pleasant affect. Her speech is clear. No signs of tremulousness.   Data Reviewed: Basic Metabolic Panel:  Recent Labs Lab 12/28/14 1127 12/29/14 0554  NA 130* 132*  K 2.9* 3.5  CL 103 108  CO2 20* 18*  GLUCOSE 126* 104*  BUN 13 9  CREATININE 0.80 0.65  CALCIUM 8.1* 8.3*  MG 1.7  --    Liver Function Tests:  Recent Labs Lab 12/28/14 1127  AST 26  ALT 19  ALKPHOS 87  BILITOT 0.6  PROT  6.4*  ALBUMIN 2.9*   No results for input(s): LIPASE, AMYLASE in the last 168 hours. No results for input(s): AMMONIA in the last 168 hours. CBC:  Recent Labs Lab 12/28/14 1127 12/29/14 0554  WBC 23.2* 15.4*  NEUTROABS 21.0*  --   HGB 11.5* 10.6*  HCT 32.1* 29.6*  MCV 101.6* 100.3*  PLT 191 179   Cardiac Enzymes: No results for input(s): CKTOTAL, CKMB, CKMBINDEX, TROPONINI in the last 168 hours. BNP (last 3 results) No results for input(s): BNP in the last 8760 hours.  ProBNP (last 3 results) No results for input(s): PROBNP in the last 8760 hours.  CBG: No results for input(s): GLUCAP in the last 168 hours.  Recent Results (from the past 240 hour(s))  Culture, blood (routine x 2)     Status: None (Preliminary result)   Collection Time: 12/28/14  4:27 AM  Result Value Ref Range Status   Specimen Description BLOOD LEFT ARM  Final   Special Requests BOTTLES DRAWN AEROBIC AND ANAEROBIC 8CC EACH  Final   Culture  Setup Time   Final    GRAM NEGATIVE RODS RECOVERED FROM BOTH BOTTLES Gram Stain Report Called to,Read Back By and Verified With: DICKERSON,M. AT 1705 ON 12/28/2014 BY BAUGHAM,M. Performed at [facility]    Culture NO GROWTH 1 DAY  Final   Report Status PENDING  Incomplete  Culture, blood (routine x 2)     Status: None (Preliminary  result)   Collection Time: 12/28/14  4:33 AM  Result Value Ref Range Status   Specimen Description BLOOD RIGHT ARM  Final   Special Requests BOTTLES DRAWN AEROBIC AND ANAEROBIC 8CC EACH  Final   Culture  Setup Time   Final    GRAM NEGATIVE RODS RECOVERED FROM BOTH BOTTLES Gram Stain Report Called to,Read Back By and Verified With: DICKERSON,M. AT 1705 ON 12/28/2014 BY BAUGHAM,M. Performed at [facility]    Culture NO GROWTH 1 DAY  Final   Report Status PENDING  Incomplete     Studies: Dg Chest 2 View  12/29/2014  CLINICAL DATA:  Patient with history of pneumonia.  Smoker. EXAM: CHEST  2 VIEW COMPARISON:  CT  12/28/2014; CT chest 11/12/2005 FINDINGS: Stable cardiac and mediastinal contours. Irregular bandlike consolidation within the right lower lobe. Heterogeneous opacities left lung base. Mid thoracic spine degenerative changes. No pleural effusion or pneumothorax. IMPRESSION: Bandlike opacification within the right lower lobe may represent pneumonia in the appropriate clinical setting. Followup PA and lateral chest X-ray is recommended in 3-4 weeks following trial of antibiotic therapy to ensure resolution and exclude underlying malignancy. Heterogeneous opacities left lung base may represent atelectasis. Attention on followup is recommended. Electronically Signed   By: Annia Belt M.D.   On: 12/29/2014 10:25   Ct Abdomen Pelvis W Contrast  12/28/2014  CLINICAL DATA:  Left-sided abdominal pain and vomiting. Weakness. Urinary tract infection. EXAM: CT ABDOMEN AND PELVIS WITH CONTRAST TECHNIQUE: Multidetector CT imaging of the abdomen and pelvis was performed using the standard protocol following bolus administration of intravenous contrast. CONTRAST:  50mL OMNIPAQUE IOHEXOL 300 MG/ML SOLN, OMNIPAQUE IOHEXOL 300 MG/ML SOLN COMPARISON:  CT pelvis 06/10/2009 FINDINGS: Patchy infiltration in the lung bases is nonspecific but could indicate edema or pneumonia. The liver, spleen, gallbladder, pancreas, adrenal glands, inferior vena cava, and retroperitoneal lymph nodes are unremarkable. Calcification of abdominal aorta without aneurysm. 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 Heterogeneous nephrogram to both kidneys but more prominent on the left kidney. Left kidney is diffusely enlarged with a focal a zones of low attenuation. Appearance is consistent with pyelonephritis. No abscess identified. No hydronephrosis. Mild stranding around the left kidney. The stomach, small bowel, and colon appear normal. No free air or free fluid in the abdomen. Abdominal wall musculature appears intact. Pelvis: The  appendix is normal. Uterus and ovaries are not enlarged. Surgical clips consistent with tubal ligations. Bladder wall is not thickened. No free or loculated pelvic fluid collections. No pelvic mass or lymphadenopathy. Degenerative changes in the spine. No destructive bone lesions. IMPRESSION: Changes of bilateral pyelonephritis, greater on the left. Patchy airspace disease in the lung bases may indicate edema or pneumonia. Electronically Signed   By: Burman Nieves M.D.   On: 12/28/2014 05:13    Scheduled Meds: . amLODipine  10 mg Oral Daily  . enoxaparin (LOVENOX) injection  40 mg Subcutaneous Q24H  . folic acid  1 mg Oral Daily  . levofloxacin (LEVAQUIN) IV  750 mg Intravenous Q24H  . lisinopril  10 mg Oral Daily  . multivitamin with minerals  1 tablet Oral Daily  . nicotine  21 mg Transdermal Daily  . thiamine  100 mg Oral Daily   Or  . thiamine  100 mg Intravenous Daily   Continuous Infusions: . 0.9 % NaCl with KCl 20 mEq / L 100 mL/hr at 12/28/14 1307    Assessment and plan:  Principal Problem:   Pyelonephritis Active Problems:  Gram-negative bacteremia (HCC)   Hyponatremia   Hypokalemia   Alcohol abuse   Tobacco abuse   Macrocytic anemia    1. Acute bilateral pyelonephritis. On admission, the patient complained of flank pain. She was afebrile. Her white blood cell count was 23.2. Her lactic acid level was within normal limits. Her urinalysis was consistent with infection. CT of her abdomen/pelvis revealed bilateral polynephritis. Urine culture and blood cultures were ordered. She was started on Rocephin. However it was discontinued in favor of Levaquin to cover possible pneumonia and pyelonephritis. -She has improved clinically with decrease in her white blood cell count, but both blood cultures are growing gram-negative rods, likely from the polynephritis. -I do not believe she has septicemia. -We'll continue Levaquin and follow-up on blood cultures and urine  culture.  Trichomonas in the urine. Patient was treated with oral metronidazole in the ED. -HIV serology was negative.  Possible pneumonia. CT of the pelvis on admission revealed patchy airspace disease in the lung bases. Follow-up 2 view x-ray revealed a bandlike opacity within the right lower lobe which may represent pneumonia versus atelectasis. The patient has no pulmonary symptoms. -We'll continue antibiotic as above. -Recommend follow-up chest x-ray in 3-4 weeks or possibly CT of the chest in 3-4 weeks.  Alcohol abuse. Patient readily admits to drinking hard liquor on the weekends and 3-412 ounce beers during the week. She wants to stop drinking. She has investigated inpatient rehabilitation for treatment. She was encouraged to stop drinking completely. CIWA protocol/Ativan/vitamin therapy was ordered. She has no signs or symptoms consistent with withdrawal syndrome. Clinical social worker consulted for assistance with sobriety.  Tobacco abuse. The patient was encouraged to stop smoking. We'll continue nicotine patch.  Essential hypertension. Patient's blood pressure is low-normal. So we'll decrease the dose of amlodipine and lisinopril to 5 mg each.  Hyponatremia. Patient serum sodium was 130 on admission. Etiology likely from hypovolemia and infection. She was started on IV fluids. Her serum sodium has improved.  Hypokalemia. The patient serum potassium was 2.9 on admission. Etiology could be secondary to alcoholism/alcohol abuse. She was repleted with potassium chloride in IV fluids and orally. Her serum potassium has improved. We'll continue supplementation.  Macrocytic anemia/iron deficiency anemia. Patient's macrocytosis is likely secondary to alcohol abuse. Her anemia panel revealed a total iron of 6, ferritin 475, folate 18.5, vitamin B12- 526. We'll continue multivitamin. Will consider oral ferrous sulfate at the time of discharge. -Her TSH was slightly low at 0.296.  Would recommend follow-up TSH in the next 3-6 months. No clinical sign of hyperthyroidism.    Time spent: 35 minutes    [facility]  [facility] Pager [phone]. If 7PM-7AM, please contact night-coverage at [email or web address], password [facility] 12/29/2014, 10:52 AM  LOS: 1 day

## 2014-12-30 LAB — CULTURE, BLOOD (ROUTINE X 2)

## 2014-12-30 LAB — BASIC METABOLIC PANEL WITH GFR
Anion gap: 6 (ref 5–15)
BUN: 7 mg/dL (ref 6–20)
CO2: 17 mmol/L — ABNORMAL LOW (ref 22–32)
Calcium: 8.5 mg/dL — ABNORMAL LOW (ref 8.9–10.3)
Chloride: 108 mmol/L (ref 101–111)
Creatinine, Ser: 0.68 mg/dL (ref 0.44–1.00)
GFR calc Af Amer: >60 mL/min
GFR calc non Af Amer: >60 mL/min
Glucose, Bld: 131 mg/dL — ABNORMAL HIGH (ref 65–99)
Potassium: 3.8 mmol/L (ref 3.5–5.1)
Sodium: 131 mmol/L — ABNORMAL LOW (ref 135–145)

## 2014-12-30 LAB — CBC
HCT: 31.5 % — ABNORMAL LOW (ref 36.0–46.0)
HEMOGLOBIN: 11.2 g/dL — AB (ref 12.0–15.0)
MCH: 35.7 pg — AB (ref 26.0–34.0)
MCHC: 35.6 g/dL (ref 30.0–36.0)
MCV: 100.3 fL — AB (ref 78.0–100.0)
Platelets: 189 10*3/uL (ref 150–400)
RBC: 3.14 MIL/uL — AB (ref 3.87–5.11)
RDW: 13.4 % (ref 11.5–15.5)
WBC: 11.7 10*3/uL — ABNORMAL HIGH (ref 4.0–10.5)

## 2014-12-30 LAB — MAGNESIUM: Magnesium: 1.5 mg/dL — ABNORMAL LOW (ref 1.7–2.4)

## 2014-12-30 LAB — URINE CULTURE: Culture: >=100000

## 2014-12-30 MED ORDER — IBUPROFEN 400 MG PO TABS: 400.0000 mg | ORAL_TABLET | Freq: Four times a day (QID) | ORAL | Status: DC | PRN

## 2014-12-30 MED ORDER — HYDROCODONE-ACETAMINOPHEN 5-325 MG PO TABS
1.0000 | ORAL_TABLET | ORAL | Status: DC | PRN
  Administered 2014-12-30 – 2014-12-31 (×3): 1 via ORAL
  Filled 2014-12-30 (×3): qty 1

## 2014-12-30 MED ORDER — SODIUM CHLORIDE 0.45 % IV SOLN
INTRAVENOUS | Status: DC
  Administered 2014-12-30: 14:00:00 via INTRAVENOUS
  Filled 2014-12-30 (×5): qty 1000

## 2014-12-30 NOTE — Care Management Important Message (Signed)
Important Message  Patient Details  Name: Catarina HartshornBernice W Lisa MRN: [id number] Date of Birth: 01-10-1956   Medicare Important Message Given:  Yes-second notification given    Malcolm MetroChildress, Konstantina Nachreiner Demske, RN 12/30/2014, 4:29 PM

## 2014-12-30 NOTE — Progress Notes (Signed)
[facility] PROGRESS NOTE  Paula Gomez ZOX:[id number] DOB: 1955/04/15 DOA: 12/28/2014 [facility] [place]    Code Status: Full code Family Communication: Discussed with sisters with permission on 12/28/14. Disposition Plan: Discharge when clinically appropriate, possibly tomorrow.   Consultants:  None  Procedures:  None  Antibiotics:  Levaquin 12/28/14>>  Rocephin 12/28/14>> 12/28/14  Metronidazole 2 g times one given in ED.  HPI/Subjective: Patient says that she felt nervous overnight and was given Ativan which helped. She feels better this morning. She denies nausea, vomiting, diarrhea. She denies pain with urination.  Objective: Filed Vitals:   12/30/14 0634  BP: 111/81  Pulse: 113  Temp: 98.7 F (37.1 C)  Resp: 18   oxygen saturation 100% on room air  Intake/Output Summary (Last 24 hours) at 12/30/14 1437 Last data filed at 12/30/14 0900  Gross per 24 hour  Intake 1739.17 ml  Output      0 ml  Net 1739.17 ml   Filed Weights   12/28/14 0744  Weight: 58.202 kg (128 lb 5 oz)    Exam:   General:  Pleasant 59 year old African-American woman in no acute distress.  Cardiovascular: S1, S2, with mild tachycardia.  Respiratory: Decreased breath sounds in the bases, rare crackles on the right. Breathing is nonlabored.  Abdomen: Positive bowel sounds, soft, nontender, nondistended.  Musculoskeletal/extremities: Pedal pulses palpable. No pedal edema. No acute hot red joints.  Neurologic/psychiatric: The patient is alert and oriented 3. Cranial nerves II through XII are intact. She has a pleasant affect. Her speech is clear.   Data Reviewed: Basic Metabolic Panel:  Recent Labs Lab 12/28/14 1127 12/29/14 0554 12/30/14 0640  NA 130* 132* 131*  K 2.9* 3.5 3.8  CL 103 108 108  CO2 20* 18* 17*  GLUCOSE 126* 104* 131*  BUN 13 9 7   CREATININE 0.80 0.65 0.68  CALCIUM 8.1* 8.3* 8.5*  MG 1.7  --  1.5*   Liver Function Tests:  Recent  Labs Lab 12/28/14 1127  AST 26  ALT 19  ALKPHOS 87  BILITOT 0.6  PROT 6.4*  ALBUMIN 2.9*   No results for input(s): LIPASE, AMYLASE in the last 168 hours. No results for input(s): AMMONIA in the last 168 hours. CBC:  Recent Labs Lab 12/28/14 1127 12/29/14 0554 12/30/14 0640  WBC 23.2* 15.4* 11.7*  NEUTROABS 21.0*  --   --   HGB 11.5* 10.6* 11.2*  HCT 32.1* 29.6* 31.5*  MCV 101.6* 100.3* 100.3*  PLT 191 179 189   Cardiac Enzymes: No results for input(s): CKTOTAL, CKMB, CKMBINDEX, TROPONINI in the last 168 hours. BNP (last 3 results) No results for input(s): BNP in the last 8760 hours.  ProBNP (last 3 results) No results for input(s): PROBNP in the last 8760 hours.  CBG: No results for input(s): GLUCAP in the last 168 hours.  Recent Results (from the past 240 hour(s))  Urine culture     Status: None   Collection Time: 12/28/14  3:05 AM  Result Value Ref Range Status   Specimen Description URINE, CLEAN CATCH  Final   Special Requests NONE  Final   Culture   Final    >=100,000 COLONIES/mL ESCHERICHIA COLI Performed at [facility]    Report Status 12/30/2014 FINAL  Final   Organism ID, Bacteria ESCHERICHIA COLI  Final      Susceptibility   Escherichia coli - MIC*    AMPICILLIN >=32 RESISTANT Resistant     CEFAZOLIN <=4 SENSITIVE Sensitive  CEFTRIAXONE <=1 SENSITIVE Sensitive     CIPROFLOXACIN <=0.25 SENSITIVE Sensitive     GENTAMICIN <=1 SENSITIVE Sensitive     IMIPENEM <=0.25 SENSITIVE Sensitive     NITROFURANTOIN <=16 SENSITIVE Sensitive     TRIMETH/SULFA >=320 RESISTANT Resistant     AMPICILLIN/SULBACTAM 16 INTERMEDIATE Intermediate     PIP/TAZO <=4 SENSITIVE Sensitive     * >=100,000 COLONIES/mL ESCHERICHIA COLI  Culture, blood (routine x 2)     Status: None   Collection Time: 12/28/14  4:27 AM  Result Value Ref Range Status   Specimen Description BLOOD LEFT ARM  Final   Special Requests BOTTLES DRAWN AEROBIC AND ANAEROBIC 8CC EACH  Final    Culture  Setup Time   Final    GRAM NEGATIVE RODS RECOVERED FROM BOTH BOTTLES Gram Stain Report Called to,Read Back By and Verified With: DICKERSON,M. AT 1705 ON 12/28/2014 BY BAUGHAM,M. Performed at [facility]    Culture   Final    ESCHERICHIA COLI Performed at [facility]    Report Status 12/30/2014 FINAL  Final   Organism ID, Bacteria ESCHERICHIA COLI  Final      Susceptibility   Escherichia coli - MIC*    AMPICILLIN >=32 RESISTANT Resistant     CEFAZOLIN <=4 SENSITIVE Sensitive     CEFEPIME <=1 SENSITIVE Sensitive     CEFTAZIDIME <=1 SENSITIVE Sensitive     CEFTRIAXONE <=1 SENSITIVE Sensitive     CIPROFLOXACIN <=0.25 SENSITIVE Sensitive     GENTAMICIN <=1 SENSITIVE Sensitive     IMIPENEM <=0.25 SENSITIVE Sensitive     TRIMETH/SULFA >=320 RESISTANT Resistant     AMPICILLIN/SULBACTAM 16 INTERMEDIATE Intermediate     PIP/TAZO <=4 SENSITIVE Sensitive     * ESCHERICHIA COLI  Culture, blood (routine x 2)     Status: None   Collection Time: 12/28/14  4:33 AM  Result Value Ref Range Status   Specimen Description BLOOD RIGHT ARM  Final   Special Requests BOTTLES DRAWN AEROBIC AND ANAEROBIC 8CC EACH  Final   Culture  Setup Time   Final    GRAM NEGATIVE RODS RECOVERED FROM BOTH BOTTLES Gram Stain Report Called to,Read Back By and Verified With: DICKERSON,M. AT 1705 ON 12/28/2014 BY BAUGHAM,M. Performed at [facility]    Culture   Final    ESCHERICHIA COLI SUSCEPTIBILITIES PERFORMED ON PREVIOUS CULTURE WITHIN THE LAST 5 DAYS. Performed at [facility]    Report Status 12/30/2014 FINAL  Final     Studies: Dg Chest 2 View  12/29/2014  CLINICAL DATA:  Patient with history of pneumonia.  Smoker. EXAM: CHEST  2 VIEW COMPARISON:  CT 12/28/2014; CT chest 11/12/2005 FINDINGS: Stable cardiac and mediastinal contours. Irregular bandlike consolidation within the right lower lobe. Heterogeneous opacities left lung base. Mid thoracic spine degenerative  changes. No pleural effusion or pneumothorax. IMPRESSION: Bandlike opacification within the right lower lobe may represent pneumonia in the appropriate clinical setting. Followup PA and lateral chest X-ray is recommended in 3-4 weeks following trial of antibiotic therapy to ensure resolution and exclude underlying malignancy. Heterogeneous opacities left lung base may represent atelectasis. Attention on followup is recommended. Electronically Signed   By: Annia Belt M.D.   On: 12/29/2014 10:25    Scheduled Meds: . amLODipine  5 mg Oral Daily  . enoxaparin (LOVENOX) injection  40 mg Subcutaneous Q24H  . folic acid  1 mg Oral Daily  . levofloxacin (LEVAQUIN) IV  750 mg Intravenous Q24H  . lisinopril  5 mg Oral Daily  . multivitamin with minerals  1 tablet Oral Daily  . nicotine  21 mg Transdermal Daily  . potassium chloride  20 mEq Oral BID  . thiamine  100 mg Oral Daily   Or  . thiamine  100 mg Intravenous Daily   Continuous Infusions: . sodium chloride 0.45 % 1,000 mL with potassium chloride 20 mEq, sodium bicarbonate 50 mEq infusion 65 mL/hr at 12/30/14 1425    Assessment and plan:  Principal Problem:   Pyelonephritis Active Problems:   Gram-negative bacteremia (HCC)   Hyponatremia   Hypokalemia   Alcohol abuse   Tobacco abuse   Macrocytic anemia    1. Acute bilateral pyelonephritis. On admission, the patient complained of flank pain. She was afebrile. Her white blood cell count was 23.2. Her lactic acid level was within normal limits. Her urinalysis was consistent with infection. CT of her abdomen/pelvis revealed bilateral polynephritis. Urine culture and blood cultures were ordered. She was started on Rocephin. However it was discontinued in favor of Levaquin to cover possible pneumonia and pyelonephritis. -She has improved clinically with decrease in her white blood cell count -I do not believe she has septicemia. -Urine culture is growing out gram-negative rods, likely  secondary to Escherichia coli. Escherichia coli apparently sensitive to Cipro so it is presumed that it is sensitive to quinolones. Will continue Levaquin for now, given possible pneumonia and clinical improvement. -Would transition to oral Levaquin at the time of discharge. Trichomonas in the urine. Patient was treated with oral metronidazole in the ED. -HIV serology was negative. Possible pneumonia. CT of the pelvis on admission revealed patchy airspace disease in the lung bases. Follow-up 2 view x-ray revealed a bandlike opacity within the right lower lobe which may represent pneumonia versus atelectasis. The patient has no pulmonary symptoms. -We'll continue antibiotic as above. -Recommend follow-up chest x-ray in 3-4 weeks or possibly CT of the chest in 3-4 weeks. Alcohol abuse. Patient readily admits to drinking hard liquor on the weekends and 3-412 ounce beers during the week. She wants to stop drinking. She has [facility] She was encouraged to stop drinking completely. CIWA protocol/Ativan/vitamin therapy was ordered. She has no signs or symptoms consistent with withdrawal syndrome; although, she stated that she was nervous last night. Ativan helped. Clinical social worker consulted for assistance with sobriety. Tobacco abuse. The patient was encouraged to stop smoking. We'll continue nicotine patch. Essential hypertension. Patient's blood pressure is low-normal. So we'll decrease the dose of amlodipine and lisinopril to 5 mg each. Hyponatremia. Patient serum sodium was 130 on admission. Etiology likely from hypovolemia and infection. She was started on IV fluids. Her serum sodium has improved. Metabolic acidosis. The patient's CO2 was 20 on admission. It has decreased to 17. Her lactic acid level was within normal limits on admission. IV fluids were changed at sodium bicarbonate today. We'll check another CO2 tomorrow  morning. Hypokalemia. The patient serum potassium was 2.9 on admission. Etiology could be secondary to alcoholism/alcohol abuse. She was repleted with potassium chloride in IV fluids and orally. Her serum potassium has improved. We'll continue supplementation. Macrocytic anemia/iron deficiency anemia. Patient's macrocytosis is likely secondary to alcohol abuse. Her anemia panel revealed a total iron of 6, ferritin 475, folate 18.5, vitamin B12- 526. We'll continue multivitamin. Will add ferrous sulfate every morning. -Her TSH was slightly low at 0.296. Would recommend follow-up TSH in the next 3-6 months. No clinical sign of hyperthyroidism.  Time spent: 30 minutes    [facility]  [facility] Pager [phone]. If 7PM-7AM, please contact night-coverage at [email or web address], password [facility] 12/30/2014, 2:37 PM  LOS: 2 days

## 2014-12-31 DIAGNOSIS — E876 Hypokalemia: Secondary | ICD-10-CM

## 2014-12-31 DIAGNOSIS — R7881 Bacteremia: Secondary | ICD-10-CM

## 2014-12-31 DIAGNOSIS — E871 Hypo-osmolality and hyponatremia: Secondary | ICD-10-CM

## 2014-12-31 DIAGNOSIS — N12 Tubulo-interstitial nephritis, not specified as acute or chronic: Secondary | ICD-10-CM

## 2014-12-31 DIAGNOSIS — F1010 Alcohol abuse, uncomplicated: Secondary | ICD-10-CM

## 2014-12-31 LAB — CBC
HCT: 31.9 % — ABNORMAL LOW (ref 36.0–46.0)
Hemoglobin: 11.3 g/dL — ABNORMAL LOW (ref 12.0–15.0)
MCH: 35.6 pg — ABNORMAL HIGH (ref 26.0–34.0)
MCHC: 35.4 g/dL (ref 30.0–36.0)
MCV: 100.6 fL — AB (ref 78.0–100.0)
PLATELETS: 191 10*3/uL (ref 150–400)
RBC: 3.17 MIL/uL — AB (ref 3.87–5.11)
RDW: 13.6 % (ref 11.5–15.5)
WBC: 13.3 10*3/uL — AB (ref 4.0–10.5)

## 2014-12-31 LAB — BASIC METABOLIC PANEL
ANION GAP: 6 (ref 5–15)
BUN: 7 mg/dL (ref 6–20)
CALCIUM: 8.7 mg/dL — AB (ref 8.9–10.3)
CO2: 20 mmol/L — ABNORMAL LOW (ref 22–32)
Chloride: 104 mmol/L (ref 101–111)
Creatinine, Ser: 0.69 mg/dL (ref 0.44–1.00)
Glucose, Bld: 89 mg/dL (ref 65–99)
POTASSIUM: 4.4 mmol/L (ref 3.5–5.1)
Sodium: 130 mmol/L — ABNORMAL LOW (ref 135–145)

## 2014-12-31 MED ORDER — LEVOFLOXACIN 750 MG PO TABS: 750.0000 mg | ORAL_TABLET | Freq: Every day | ORAL | Status: DC

## 2014-12-31 MED ORDER — AMLODIPINE BESYLATE 10 MG PO TABS: 5.0000 mg | ORAL_TABLET | Freq: Every day | ORAL | Status: DC

## 2014-12-31 NOTE — Progress Notes (Signed)
Patient d/c home with family, no c/o pain at d/c. Left floor via wheelchair accompanied by staff. D/c instructions and RX's  reviewed with patient verbalized understanding. Paula Gomez [employer] CompanyLynn

## 2014-12-31 NOTE — Discharge Summary (Addendum)
Physician Discharge Summary  [name] KZS:[id number] DOB: [birth year] DOA: 12/28/2014  [facility]  [facility]  Admit date: 12/28/2014 Discharge date: 12/31/2014  Time spent:  Recommendations for Outpatient Follow-up:  1. Follow up with primary care physician in 1-2 weeks  Discharge Diagnoses:  Principal Problem:   Pyelonephritis due to Escherichia coli Active Problems:   Hyponatremia   Hypokalemia   Alcohol abuse   Tobacco abuse   Macrocytic anemia   Escherichia coli sepsis, present on admission   Discharge Condition: improved  Diet recommendation: heart healthy  Filed Weights   12/28/14 0744  Weight: 58.202 kg (128 lb 5 oz)    History of present illness:  This patient presents to the hospital with complaints of left flank pain and weakness. Her symptoms began 48 hours prior to admission had worsening over the past 24 hours prior to admission. She is a chronic alcoholic. She was evaluated in the emergency room and noted to have bilateral pyelonephritis as well as evidence of dehydration. She was admitted for further treatments.  Hospital Course:  Patient was admitted to the hospital with bilateral pyelonephritis due to Escherichia coli. She also had positive blood cultures for Escherichia coli and evidence of sepsis. She was treated with IV fluids and antibiotics and has since improved. She is afebrile and her leukocytosis has resolved. She is in transition to oral antibiotics and will complete a total of 14 days of therapy. CT imaging of her abdomen also indicated possible lower lung airspace disease consistent with possible pneumonia. Fortunately her antibiotic should cover any underlying pneumonia as well.  Regarding her alcohol abuse. She was placed on CIWA protocol and does not have any signs of alcohol withdrawal at this time. The patient recognizes that she is addicted to alcohol and will actively seek out alcohol rehabilitation programs to her primary [place]. She  appears motivated to quit drinking.  Her blood pressure remained on the lower side of normal and therefore her amlodipine dose was decreased from 5 mg 10 mg. We'll continue lisinopril for now. Hold hydrochlorothiazide  Patient was also found to be hyponatremic which was likely related to a combination of dehydration, B report of mania as well as hydrochlorothiazide use. Her serum sodium is still low at 1:30 appears stable. She does not appear to have any symptoms. This can be further followed in the outpatient setting.  Procedures:    Consultations:    Discharge Exam: Filed Vitals:   12/31/14 1510  BP: 106/70  Pulse: 99  Temp: 98 F (36.7 C)  Resp: 20    General: NAD Cardiovascular: S1, S2 RRR Respiratory: CTA B  Discharge Instructions   Discharge Instructions    Diet - low sodium heart healthy    Complete by:  As directed      Increase activity slowly    Complete by:  As directed           Current Discharge Medication List    START taking these medications   Details  levofloxacin (LEVAQUIN) 750 MG tablet Take 1 tablet (750 mg total) by mouth daily. Qty: 10 tablet, Refills: 0      CONTINUE these medications which have CHANGED   Details  amLODipine (NORVASC) 10 MG tablet Take 0.5 tablets (5 mg total) by mouth daily.      CONTINUE these medications which have NOT CHANGED   Details  cyclobenzaprine (FLEXERIL) 10 MG tablet Take 5 mg by mouth 2 (two) times daily as needed for muscle spasms.  ibuprofen (ADVIL,MOTRIN) 600 MG tablet Take 600 mg by mouth 3 (three) times daily as needed for mild pain.    lisinopril (PRINIVIL,ZESTRIL) 20 MG tablet Take 10 mg by mouth daily.      STOP taking these medications     hydrochlorothiazide (HYDRODIURIL) 25 MG tablet        No Known Allergies    The results of significant diagnostics from this hospitalization (including imaging, microbiology, ancillary and laboratory) are listed below for reference.     Significant Diagnostic Studies: Dg Chest 2 View  12/29/2014  CLINICAL DATA:  Patient with history of pneumonia.  Smoker. EXAM: CHEST  2 VIEW COMPARISON:  CT 12/28/2014; CT chest 11/12/2005 FINDINGS: Stable cardiac and mediastinal contours. Irregular bandlike consolidation within the right lower lobe. Heterogeneous opacities left lung base. Mid thoracic spine degenerative changes. No pleural effusion or pneumothorax. IMPRESSION: Bandlike opacification within the right lower lobe may represent pneumonia in the appropriate clinical setting. Followup PA and lateral chest X-ray is recommended in 3-4 weeks following trial of antibiotic therapy to ensure resolution and exclude underlying malignancy. Heterogeneous opacities left lung base may represent atelectasis. Attention on followup is recommended. Electronically Signed   By: Annia Belt M.D.   On: 12/29/2014 10:25   Ct Abdomen Pelvis W Contrast  12/28/2014  CLINICAL DATA:  Left-sided abdominal pain and vomiting. Weakness. Urinary tract infection. EXAM: CT ABDOMEN AND PELVIS WITH CONTRAST TECHNIQUE: Multidetector CT imaging of the abdomen and pelvis was performed using the standard protocol following bolus administration of intravenous contrast. CONTRAST:  50mL OMNIPAQUE IOHEXOL 300 MG/ML SOLN, OMNIPAQUE IOHEXOL 300 MG/ML SOLN COMPARISON:  CT pelvis 06/10/2009 FINDINGS: Patchy infiltration in the lung bases is nonspecific but could indicate edema or pneumonia. The liver, spleen, gallbladder, pancreas, adrenal glands, inferior vena cava, and retroperitoneal lymph nodes are unremarkable. Calcification of abdominal aorta without aneurysm. 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 Heterogeneous nephrogram to both kidneys but more prominent on the left kidney. Left kidney is diffusely enlarged with a focal a zones of low attenuation. Appearance is consistent with pyelonephritis. No abscess identified. No hydronephrosis. Mild stranding around the  left kidney. The stomach, small bowel, and colon appear normal. No free air or free fluid in the abdomen. Abdominal wall musculature appears intact. Pelvis: The appendix is normal. Uterus and ovaries are not enlarged. Surgical clips consistent with tubal ligations. Bladder wall is not thickened. No free or loculated pelvic fluid collections. No pelvic mass or lymphadenopathy. Degenerative changes in the spine. No destructive bone lesions. IMPRESSION: Changes of bilateral pyelonephritis, greater on the left. Patchy airspace disease in the lung bases may indicate edema or pneumonia. Electronically Signed   By: Burman Nieves M.D.   On: 12/28/2014 05:13    Microbiology: Recent Results (from the past 240 hour(s))  Urine culture     Status: None   Collection Time: 12/28/14  3:05 AM  Result Value Ref Range Status   Specimen Description URINE, CLEAN CATCH  Final   Special Requests NONE  Final   Culture   Final    >=100,000 COLONIES/mL ESCHERICHIA COLI Performed at [facility]    Report Status 12/30/2014 FINAL  Final   Organism ID, Bacteria ESCHERICHIA COLI  Final      Susceptibility   Escherichia coli - MIC*    AMPICILLIN >=32 RESISTANT Resistant     CEFAZOLIN <=4 SENSITIVE Sensitive     CEFTRIAXONE <=1 SENSITIVE Sensitive     CIPROFLOXACIN <=0.25 SENSITIVE Sensitive  GENTAMICIN <=1 SENSITIVE Sensitive     IMIPENEM <=0.25 SENSITIVE Sensitive     NITROFURANTOIN <=16 SENSITIVE Sensitive     TRIMETH/SULFA >=320 RESISTANT Resistant     AMPICILLIN/SULBACTAM 16 INTERMEDIATE Intermediate     PIP/TAZO <=4 SENSITIVE Sensitive     * >=100,000 COLONIES/mL ESCHERICHIA COLI  Culture, blood (routine x 2)     Status: None   Collection Time: 12/28/14  4:27 AM  Result Value Ref Range Status   Specimen Description BLOOD LEFT ARM  Final   Special Requests BOTTLES DRAWN AEROBIC AND ANAEROBIC 8CC EACH  Final   Culture  Setup Time   Final    GRAM NEGATIVE RODS RECOVERED FROM BOTH BOTTLES Gram  Stain Report Called to,Read Back By and Verified With: DICKERSON,M. AT 1705 ON 12/28/2014 BY BAUGHAM,M. Performed at [facility]    Culture   Final    ESCHERICHIA COLI Performed at [facility]    Report Status 12/30/2014 FINAL  Final   Organism ID, Bacteria ESCHERICHIA COLI  Final      Susceptibility   Escherichia coli - MIC*    AMPICILLIN >=32 RESISTANT Resistant     CEFAZOLIN <=4 SENSITIVE Sensitive     CEFEPIME <=1 SENSITIVE Sensitive     CEFTAZIDIME <=1 SENSITIVE Sensitive     CEFTRIAXONE <=1 SENSITIVE Sensitive     CIPROFLOXACIN <=0.25 SENSITIVE Sensitive     GENTAMICIN <=1 SENSITIVE Sensitive     IMIPENEM <=0.25 SENSITIVE Sensitive     TRIMETH/SULFA >=320 RESISTANT Resistant     AMPICILLIN/SULBACTAM 16 INTERMEDIATE Intermediate     PIP/TAZO <=4 SENSITIVE Sensitive     * ESCHERICHIA COLI  Culture, blood (routine x 2)     Status: None   Collection Time: 12/28/14  4:33 AM  Result Value Ref Range Status   Specimen Description BLOOD RIGHT ARM  Final   Special Requests BOTTLES DRAWN AEROBIC AND ANAEROBIC 8CC EACH  Final   Culture  Setup Time   Final    GRAM NEGATIVE RODS RECOVERED FROM BOTH BOTTLES Gram Stain Report Called to,Read Back By and Verified With: DICKERSON,M. AT 1705 ON 12/28/2014 BY BAUGHAM,M. Performed at [facility]    Culture   Final    ESCHERICHIA COLI SUSCEPTIBILITIES PERFORMED ON PREVIOUS CULTURE WITHIN THE LAST 5 DAYS. Performed at [facility]    Report Status 12/30/2014 FINAL  Final     Labs: Basic Metabolic Panel:  Recent Labs Lab 12/28/14 1127 12/29/14 0554 12/30/14 0640 12/31/14 0627  NA 130* 132* 131* 130*  K 2.9* 3.5 3.8 4.4  CL 103 108 108 104  CO2 20* 18* 17* 20*  GLUCOSE 126* 104* 131* 89  BUN 13 9 7 7   CREATININE 0.80 0.65 0.68 0.69  CALCIUM 8.1* 8.3* 8.5* 8.7*  MG 1.7  --  1.5*  --    Liver Function Tests:  Recent Labs Lab 12/28/14 1127  AST 26  ALT 19  ALKPHOS 87  BILITOT 0.6  PROT 6.4*   ALBUMIN 2.9*   No results for input(s): LIPASE, AMYLASE in the last 168 hours. No results for input(s): AMMONIA in the last 168 hours. CBC:  Recent Labs Lab 12/28/14 1127 12/29/14 0554 12/30/14 0640 12/31/14 0627  WBC 23.2* 15.4* 11.7* 13.3*  NEUTROABS 21.0*  --   --   --   HGB 11.5* 10.6* 11.2* 11.3*  HCT 32.1* 29.6* 31.5* 31.9*  MCV 101.6* 100.3* 100.3* 100.6*  PLT 191 179 189 191   Cardiac Enzymes: No  results for input(s): CKTOTAL, CKMB, CKMBINDEX, TROPONINI in the last 168 hours. BNP: BNP (last 3 results) No results for input(s): BNP in the last 8760 hours.  ProBNP (last 3 results) No results for input(s): PROBNP in the last 8760 hours.  CBG: No results for input(s): GLUCAP in the last 168 hours.     Signed:  Mckenna Gamm  [facility] 12/31/2014, 4:19 PM

## 2016-02-27 DIAGNOSIS — T783XXA Angioneurotic edema, initial encounter: Secondary | ICD-10-CM | POA: Insufficient documentation

## 2016-03-22 ENCOUNTER — Other Ambulatory Visit: Payer: Self-pay | Admitting: *Deleted

## 2016-03-22 DIAGNOSIS — I6523 Occlusion and stenosis of bilateral carotid arteries: Secondary | ICD-10-CM

## 2016-04-02 ENCOUNTER — Encounter: Payer: Self-pay | Admitting: Vascular Surgery

## 2016-04-05 ENCOUNTER — Other Ambulatory Visit: Payer: Self-pay

## 2016-04-05 ENCOUNTER — Encounter: Payer: Self-pay | Admitting: Vascular Surgery

## 2016-04-05 ENCOUNTER — Ambulatory Visit (HOSPITAL_COMMUNITY)
Admission: RE | Admit: 2016-04-05 | Discharge: 2016-04-05 | Disposition: A | Discharge status: Home / Self Care | Payer: Non-veteran care | Source: Ambulatory Visit | Attending: Vascular Surgery | Admitting: Vascular Surgery

## 2016-04-05 ENCOUNTER — Ambulatory Visit (INDEPENDENT_AMBULATORY_CARE_PROVIDER_SITE_OTHER): Payer: Non-veteran care | Admitting: Vascular Surgery

## 2016-04-05 VITALS — BP 110/82 | HR 94 | Temp 96.6°F | Resp 16 | Ht 68.0 in | Wt 157.0 lb

## 2016-04-05 DIAGNOSIS — I6523 Occlusion and stenosis of bilateral carotid arteries: Secondary | ICD-10-CM

## 2016-04-05 LAB — VAS US CAROTID
LCCAPDIAS: 39 cm/s
LCCAPSYS: 96 cm/s
LEFT ECA DIAS: -28 cm/s
LEFT VERTEBRAL DIAS: 32 cm/s
LICAPDIAS: -296 cm/s
Left CCA dist dias: -28 cm/s
Left CCA dist sys: -63 cm/s
Left ICA dist dias: -163 cm/s
Left ICA dist sys: -311 cm/s
Left ICA prox sys: -582 cm/s
RIGHT CCA MID DIAS: 15 cm/s
RIGHT ECA DIAS: -26 cm/s
RIGHT VERTEBRAL DIAS: -18 cm/s
Right CCA prox dias: -18 cm/s
Right CCA prox sys: -67 cm/s

## 2016-04-05 NOTE — Progress Notes (Signed)
Patient name: Paula Gomez MRN: [id number] DOB: [birth year] Sex: female  REASON FOR CONSULT: Bilateral carotid disease. Reffered by the [facility].  HPI: Paula Gomez is a 61 y.o. female, who had some abnormalities on an ophthalmologic exam which prompted a carotid duplex scan. She says she had some retinal hemorrhages bilaterally. Her carotid duplex scan showed an occluded right internal carotid artery with a greater than 70% left carotid stenosis.  She is right-handed. She denies any history of stroke, TIAs, expressive or receptive aphasia.  She does smoke a pack per day of cigarettes and has been smoking for 47 years.   Her risk factors for peripheral vascular disease include borderline diabetes, hypertension, and tobacco use.  I have reviewed the records that were sent from the [place]. The patient was evaluated with bilateral carotid disease. Given these findings, aspirin (81 mg), and a statin were added (Pravastatin 40 mg daily).  Past Medical History:  Diagnosis Date  . Stroke [facility])     No family history on file. She denies any family history of premature cardiovascular disease.  SOCIAL HISTORY: Social History   Social History  . Marital status: Divorced    Spouse name: N/A  . Number of children: N/A  . Years of education: N/A   Occupational History  . Not on file.   Social History Main Topics  . Smoking status: Current Every Day Smoker  . Smokeless tobacco: Never Used  . Alcohol use Yes     Comment: all alcohol daily  . Drug use: Yes    Types: Marijuana, Cocaine     Comment: last used cocaine last night  . Sexual activity: Not on file   Other Topics Concern  . Not on file   Social History Narrative  . No narrative on file    No Known Allergies  Current Outpatient Prescriptions  Medication Sig Dispense Refill  . amLODipine (NORVASC) 10 MG tablet Take 0.5 tablets (5 mg total) by mouth daily.    Marland Kitchen aspirin EC 81 MG tablet Take 81 mg by mouth  daily.    . CHOLECALCIFEROL PO Take 1,000 Units by mouth 2 (two) times daily.    . cyclobenzaprine (FLEXERIL) 10 MG tablet Take 5 mg by mouth 2 (two) times daily as needed for muscle spasms.    Marland Kitchen gabapentin (NEURONTIN) 400 MG capsule Take 400 mg by mouth 3 (three) times daily.    . hydrochlorothiazide (HYDRODIURIL) 25 MG tablet Take 25 mg by mouth daily.    Marland Kitchen levofloxacin (LEVAQUIN) 750 MG tablet Take 1 tablet (750 mg total) by mouth daily. 10 tablet 0  . lisinopril (PRINIVIL,ZESTRIL) 20 MG tablet Take 10 mg by mouth daily.    . pravastatin (PRAVACHOL) 40 MG tablet Take 40 mg by mouth daily.    Marland Kitchen ibuprofen (ADVIL,MOTRIN) 600 MG tablet Take 600 mg by mouth 3 (three) times daily as needed for mild pain.     No current facility-administered medications for this visit.     REVIEW OF SYSTEMS:  [X]  denotes positive finding, [ ]  denotes negative finding Cardiac  Comments:  Chest pain or chest pressure:    Shortness of breath upon exertion:    Short of breath when lying flat:    Irregular heart rhythm:        Vascular    Pain in calf, thigh, or hip brought on by ambulation: X   Pain in feet at night that wakes you up from your sleep:  X  Blood clot in your veins:    Leg swelling:         Pulmonary    Oxygen at home:    Productive cough:     Wheezing:         Neurologic    Sudden weakness in arms or legs:     Sudden numbness in arms or legs:     Sudden onset of difficulty speaking or slurred speech:    Temporary loss of vision in one eye:     Problems with dizziness:         Gastrointestinal    Blood in stool:     Vomited blood:         Genitourinary    Burning when urinating:     Blood in urine:        Psychiatric    Major depression:         Hematologic    Bleeding problems:    Problems with blood clotting too easily:        Skin    Rashes or ulcers:        Constitutional    Fever or chills:      PHYSICAL EXAM: Vitals:   04/05/16 0927 04/05/16 0929  BP:  132/87 110/82  Pulse: 94 94  Resp: 16   Temp: (!) 96.6 F (35.9 C)   SpO2: 99%   Weight: 157 lb (71.2 kg)   Height: 5\' 8"  (1.727 m)     GENERAL: The patient is a well-nourished female, in no acute distress. The vital signs are documented above. CARDIAC: There is a regular rate and rhythm.  VASCULAR: She has a left carotid bruit. On the right side, she has a palpable femoral, popliteal, and dorsalis pedis pulse. On the left side she has a palpable femoral pulse. She has a diminished popliteal pulses and diminished dorsalis pedis pulse. She has no significant lower extremity swelling. PULMONARY: There is good air exchange bilaterally without wheezing or rales. ABDOMEN: Soft and non-tender with normal pitched bowel sounds.  MUSCULOSKELETAL: There are no major deformities or cyanosis. NEUROLOGIC: No focal weakness or paresthesias are detected. SKIN: There are no ulcers or rashes noted. PSYCHIATRIC: The patient has a normal affect.  DATA:   CT ANGIOGRAM: I reviewed the images of the CT angiogram of the neck. This shows a focal tight proximal left internal carotid artery stenosis with an occluded right internal carotid artery. There is also some moderate disease in the proximal left common carotid artery but no focal critical stenosis.  CAROTID DUPLEX: I reviewed the carotid duplex scan that was done in [place]. This shows an occluded right internal carotid artery with a greater than 80% left carotid stenosis by velocity criteria.  CAROTID DUPLEX: I have independently interpreted the carotid duplex scan in our office today. This confirms that the right internal carotid artery is occluded. The left internal carotid artery has a critical greater than 80% stenosis. Peak systolic velocity is 582 cm/s with end-diastolic velocity of 296 cm/s.  MEDICAL ISSUES:   BILATERAL CAROTID DISEASE: This patient has an occluded right internal carotid artery with this greater than 80% left carotid  stenosis. I have recommended left carotid endarterectomy in order to lower her risk of future stroke. We have discussed the importance of tobacco cessation. In addition she is now on aspirin and a statin and she knows to continue this perioperatively.  I have reviewed the indications for carotid endarterectomy, that is to lower the risk of future  stroke. I have also reviewed the potential complications of surgery, including but not limited to: bleeding, stroke (perioperative risk 1-2%), MI, nerve injury of other unpredictable medical problems. All of the patients questions were answered and they are agreeable to proceed with surgery. Her surgery is scheduled for 04/18/2016.    Waverly Ferrariickson, Omarrion Carmer [facility] [place] Beeper [phone]

## 2016-04-11 ENCOUNTER — Encounter: Payer: Self-pay | Admitting: Vascular Surgery

## 2016-04-15 ENCOUNTER — Encounter (HOSPITAL_COMMUNITY): Payer: Self-pay

## 2016-04-15 ENCOUNTER — Telehealth: Payer: Self-pay | Admitting: Vascular Surgery

## 2016-04-15 ENCOUNTER — Encounter (HOSPITAL_COMMUNITY)
Admission: RE | Admit: 2016-04-15 | Discharge: 2016-04-15 | Disposition: A | Discharge status: Home / Self Care | Payer: Non-veteran care | Source: Ambulatory Visit | Attending: Vascular Surgery | Admitting: Vascular Surgery

## 2016-04-15 DIAGNOSIS — Z01818 Encounter for other preprocedural examination: Secondary | ICD-10-CM

## 2016-04-15 HISTORY — DX: Prediabetes: R73.03

## 2016-04-15 HISTORY — DX: Other complications of anesthesia, initial encounter: T88.59XA

## 2016-04-15 HISTORY — DX: Essential (primary) hypertension: I10

## 2016-04-15 HISTORY — DX: Unspecified osteoarthritis, unspecified site: M19.90

## 2016-04-15 HISTORY — DX: Inflammatory liver disease, unspecified: K75.9

## 2016-04-15 HISTORY — DX: Adverse effect of unspecified anesthetic, initial encounter: T41.45XA

## 2016-04-15 LAB — CBC
HEMATOCRIT: 38.2 % (ref 36.0–46.0)
Hemoglobin: 12.7 g/dL (ref 12.0–15.0)
MCH: 31.8 pg (ref 26.0–34.0)
MCHC: 33.2 g/dL (ref 30.0–36.0)
MCV: 95.7 fL (ref 78.0–100.0)
PLATELETS: 243 10*3/uL (ref 150–400)
RBC: 3.99 MIL/uL (ref 3.87–5.11)
RDW: 13.3 % (ref 11.5–15.5)
WBC: 8.2 10*3/uL (ref 4.0–10.5)

## 2016-04-15 LAB — COMPREHENSIVE METABOLIC PANEL
ALK PHOS: 158 U/L — AB (ref 38–126)
ALT: 11 U/L — AB (ref 14–54)
AST: 18 U/L (ref 15–41)
Albumin: 3.7 g/dL (ref 3.5–5.0)
Anion gap: 11 (ref 5–15)
BILIRUBIN TOTAL: 0.6 mg/dL (ref 0.3–1.2)
BUN: 9 mg/dL (ref 6–20)
CALCIUM: 9.9 mg/dL (ref 8.9–10.3)
CHLORIDE: 102 mmol/L (ref 101–111)
CO2: 24 mmol/L (ref 22–32)
Creatinine, Ser: 0.9 mg/dL (ref 0.44–1.00)
Glucose, Bld: 96 mg/dL (ref 65–99)
Potassium: 4.3 mmol/L (ref 3.5–5.1)
Sodium: 137 mmol/L (ref 135–145)
TOTAL PROTEIN: 7.5 g/dL (ref 6.5–8.1)

## 2016-04-15 LAB — URINALYSIS, ROUTINE W REFLEX MICROSCOPIC
Bilirubin Urine: NEGATIVE
GLUCOSE, UA: NEGATIVE mg/dL
Hgb urine dipstick: NEGATIVE
Ketones, ur: NEGATIVE mg/dL
Leukocytes, UA: NEGATIVE
NITRITE: NEGATIVE
PH: 6 (ref 5.0–8.0)
Protein, ur: NEGATIVE mg/dL
Specific Gravity, Urine: 1.006 (ref 1.005–1.030)

## 2016-04-15 LAB — TYPE AND SCREEN
ABO/RH(D): O POS
Antibody Screen: NEGATIVE

## 2016-04-15 LAB — SURGICAL PCR SCREEN
MRSA, PCR: NEGATIVE
Staphylococcus aureus: POSITIVE — AB

## 2016-04-15 LAB — ABO/RH: ABO/RH(D): O POS

## 2016-04-15 LAB — PROTIME-INR
INR: 0.97
PROTHROMBIN TIME: 12.9 s (ref 11.4–15.2)

## 2016-04-15 LAB — APTT: aPTT: 37 seconds — ABNORMAL HIGH (ref 24–36)

## 2016-04-15 NOTE — Progress Notes (Signed)
Pt. Notified of results of Nasal swab.  Rx. For [facility]irocin [facility] [place].

## 2016-04-15 NOTE — Telephone Encounter (Signed)
I called patient back regarding precert determination for CPT 35301.  Per Darrold SpanVictor Anderson from the [phone], [phone]) all pertinent information has been given to the nurse and they are still awating her decision.  Patient is aware. [phone]

## 2016-04-15 NOTE — Pre-Procedure Instructions (Signed)
    Catarina HartshornBernice W Corkins  04/15/2016      [facility] - [place], [place] - [address] [place] [id number] Phone: [phone] Fax: [phone]    Your procedure is scheduled on 04-18-2016  Thursday   [facility] Ad[facility] at 5:30A.M.   Call this number if you have problems the morning of surgery:  [phone]   Remember:  Do not eat food or drink liquids after midnight.   Take these medicines the morning of surgery with A SIP OF WATER Amlodipine (Norvasc),Aspirin,gabapentin(Neurontin),pravastatin(Pravachol)    Do not wear jewelry, make-up or nail polish.  Do not wear lotions, powders, or perfumes, or deoderant.  Do not shave 48 hours prior to surgery.     Do not bring valuables to the hospital.  [facility] is not responsible for any belongings or valuables.  Contacts, dentures or bridgework may not be worn into surgery.  Leave your suitcase in the car.  After surgery it may be brought to your room.  For patients admitted to the hospital, discharge time will be determined by your treatment team.  Patients discharged the day of surgery will not be allowed to drive home.    Special instructions:  See attached Sheet for instructions on CHG showers  Please read over the following fact sheets that you were given. Surgical Site Infection Prevention

## 2016-04-17 NOTE — Anesthesia Preprocedure Evaluation (Addendum)
Anesthesia Evaluation  Patient identified by MRN, date of birth, ID band Patient awake    Reviewed: Allergy & Precautions, NPO status , Patient's Chart, lab work & pertinent test results  Airway Mallampati: II  TM Distance: >3 FB Neck ROM: Full    Dental no notable dental hx.    Pulmonary neg pulmonary ROS, Current Smoker,    Pulmonary exam normal breath sounds clear to auscultation       Cardiovascular hypertension, negative cardio ROS Normal cardiovascular exam Rhythm:Regular Rate:Normal     Neuro/Psych negative neurological ROS  negative psych ROS   GI/Hepatic negative GI ROS, (+) Hepatitis -  Endo/Other  negative endocrine ROS  Renal/GU negative Renal ROS  negative genitourinary   Musculoskeletal  (+) Arthritis ,   Abdominal   Peds  Hematology negative hematology ROS (+) anemia ,   Anesthesia Other Findings   Reproductive/Obstetrics negative OB ROS                             Anesthesia Physical Anesthesia Plan  ASA: II  Anesthesia Plan: General   Post-op Pain Management:    Induction: Intravenous  Airway Management Planned: Oral ETT  Additional Equipment: Arterial line  Intra-op Plan:   Post-operative Plan: Extubation in OR  Informed Consent: I have reviewed the patients History and Physical, chart, labs and discussed the procedure including the risks, benefits and alternatives for the proposed anesthesia with the patient or authorized representative who has indicated his/her understanding and acceptance.   Dental advisory given  Plan Discussed with: CRNA  Anesthesia Plan Comments: (remi gtt)       Anesthesia Quick Evaluation

## 2016-04-18 ENCOUNTER — Encounter (HOSPITAL_COMMUNITY): Payer: Self-pay | Admitting: *Deleted

## 2016-04-18 ENCOUNTER — Encounter (HOSPITAL_COMMUNITY)
Admission: RE | Disposition: A | Discharge status: Home / Self Care | Payer: Self-pay | Source: Ambulatory Visit | Attending: Vascular Surgery

## 2016-04-18 ENCOUNTER — Inpatient Hospital Stay (HOSPITAL_COMMUNITY): Payer: Non-veteran care | Admitting: Anesthesiology

## 2016-04-18 ENCOUNTER — Inpatient Hospital Stay (HOSPITAL_COMMUNITY)
Admission: RE | Admit: 2016-04-18 | Discharge: 2016-04-19 | DRG: 039 | Disposition: A | Discharge status: Home / Self Care | Payer: Non-veteran care | Source: Ambulatory Visit | Attending: Vascular Surgery | Admitting: Vascular Surgery

## 2016-04-18 ENCOUNTER — Inpatient Hospital Stay (HOSPITAL_COMMUNITY): Payer: Non-veteran care

## 2016-04-18 DIAGNOSIS — I6522 Occlusion and stenosis of left carotid artery: Secondary | ICD-10-CM | POA: Diagnosis present

## 2016-04-18 DIAGNOSIS — I6523 Occlusion and stenosis of bilateral carotid arteries: Secondary | ICD-10-CM

## 2016-04-18 DIAGNOSIS — F17200 Nicotine dependence, unspecified, uncomplicated: Secondary | ICD-10-CM | POA: Diagnosis present

## 2016-04-18 DIAGNOSIS — Z7982 Long term (current) use of aspirin: Secondary | ICD-10-CM | POA: Diagnosis not present

## 2016-04-18 DIAGNOSIS — I10 Essential (primary) hypertension: Secondary | ICD-10-CM | POA: Diagnosis present

## 2016-04-18 DIAGNOSIS — R7303 Prediabetes: Secondary | ICD-10-CM | POA: Diagnosis present

## 2016-04-18 HISTORY — PX: ENDARTERECTOMY: SHX5162

## 2016-04-18 LAB — CREATININE, SERUM
CREATININE: 0.9 mg/dL (ref 0.44–1.00)
GFR calc Af Amer: >60 mL/min (ref >60)
GFR calc non Af Amer: >60 mL/min (ref >60)

## 2016-04-18 LAB — CBC
HCT: 34.5 % — ABNORMAL LOW (ref 36.0–46.0)
HEMOGLOBIN: 11.8 g/dL — AB (ref 12.0–15.0)
MCH: 32.5 pg (ref 26.0–34.0)
MCHC: 34.2 g/dL (ref 30.0–36.0)
MCV: 95 fL (ref 78.0–100.0)
Platelets: 217 10*3/uL (ref 150–400)
RBC: 3.63 MIL/uL — AB (ref 3.87–5.11)
RDW: 13.2 % (ref 11.5–15.5)
WBC: 11.6 10*3/uL — ABNORMAL HIGH (ref 4.0–10.5)

## 2016-04-18 LAB — VAS US CAROTID
LCCAPDIAS: 45 cm/s
LICADDIAS: -65 cm/s
LICADSYS: -151 cm/s
LICAPDIAS: -24 cm/s
LICAPSYS: -63 cm/s
Left CCA dist dias: -21 cm/s
Left CCA dist sys: -76 cm/s
Left CCA prox sys: 134 cm/s

## 2016-04-18 LAB — GLUCOSE, CAPILLARY: GLUCOSE-CAPILLARY: 106 mg/dL — AB (ref 65–99)

## 2016-04-18 SURGERY — ENDARTERECTOMY, CAROTID
Anesthesia: General | Site: Neck | Laterality: Left

## 2016-04-18 MED ORDER — ONDANSETRON HCL 4 MG/2ML IJ SOLN
INTRAMUSCULAR | Status: DC | PRN
  Administered 2016-04-18: 4 mg via INTRAVENOUS

## 2016-04-18 MED ORDER — ONDANSETRON HCL 4 MG/2ML IJ SOLN
INTRAMUSCULAR | Status: AC
  Filled 2016-04-18: qty 2

## 2016-04-18 MED ORDER — DEXTROSE 5 % IV SOLN
1.5000 g | Freq: Two times a day (BID) | INTRAVENOUS | Status: AC
  Administered 2016-04-18 – 2016-04-19 (×2): 1.5 g via INTRAVENOUS
  Filled 2016-04-18 (×2): qty 1.5

## 2016-04-18 MED ORDER — SODIUM CHLORIDE 0.9 % IV SOLN
0.0125 ug/kg/min | INTRAVENOUS | Status: DC
  Administered 2016-04-18: .2 ug/kg/min via INTRAVENOUS
  Filled 2016-04-18 (×2): qty 2000

## 2016-04-18 MED ORDER — LACTATED RINGERS IV SOLN
INTRAVENOUS | Status: DC | PRN
  Administered 2016-04-18 (×2): via INTRAVENOUS

## 2016-04-18 MED ORDER — LISINOPRIL 10 MG PO TABS
10.0000 mg | ORAL_TABLET | Freq: Every day | ORAL | Status: DC
  Filled 2016-04-18: qty 1

## 2016-04-18 MED ORDER — LIDOCAINE 2% (20 MG/ML) 5 ML SYRINGE
INTRAMUSCULAR | Status: AC
  Filled 2016-04-18: qty 5

## 2016-04-18 MED ORDER — PHENYLEPHRINE HCL 10 MG/ML IJ SOLN
INTRAVENOUS | Status: DC | PRN
  Administered 2016-04-18: 30 ug/min via INTRAVENOUS

## 2016-04-18 MED ORDER — SODIUM CHLORIDE 0.9 % IV SOLN
INTRAVENOUS | Status: DC | PRN
  Administered 2016-04-18: 500 mL

## 2016-04-18 MED ORDER — DOCUSATE SODIUM 100 MG PO CAPS
100.0000 mg | ORAL_CAPSULE | Freq: Every day | ORAL | Status: DC
  Administered 2016-04-19: 100 mg via ORAL
  Filled 2016-04-18: qty 1

## 2016-04-18 MED ORDER — PROPOFOL 10 MG/ML IV BOLUS
INTRAVENOUS | Status: AC
  Filled 2016-04-18: qty 40

## 2016-04-18 MED ORDER — DIPHENHYDRAMINE HCL 50 MG/ML IJ SOLN
INTRAMUSCULAR | Status: DC | PRN
  Administered 2016-04-18: 25 mg via INTRAVENOUS

## 2016-04-18 MED ORDER — DEXTRAN 40 IN SALINE 10-0.9 % IV SOLN
INTRAVENOUS | Status: AC
  Filled 2016-04-18: qty 500

## 2016-04-18 MED ORDER — LIDOCAINE-EPINEPHRINE (PF) 1 %-1:200000 IJ SOLN
INTRAMUSCULAR | Status: DC | PRN
  Administered 2016-04-18: 30 mL

## 2016-04-18 MED ORDER — PANTOPRAZOLE SODIUM 40 MG PO TBEC
40.0000 mg | DELAYED_RELEASE_TABLET | Freq: Every day | ORAL | Status: DC
  Administered 2016-04-18 – 2016-04-19 (×2): 40 mg via ORAL
  Filled 2016-04-18 (×2): qty 1

## 2016-04-18 MED ORDER — PHENOL 1.4 % MT LIQD: 1.0000 | OROMUCOSAL | Status: DC | PRN

## 2016-04-18 MED ORDER — DEXTRAN 40 IN SALINE 10-0.9 % IV SOLN
INTRAVENOUS | Status: DC | PRN
  Administered 2016-04-18: 712 mL

## 2016-04-18 MED ORDER — HYDROCHLOROTHIAZIDE 25 MG PO TABS
25.0000 mg | ORAL_TABLET | Freq: Every day | ORAL | Status: DC
  Filled 2016-04-18: qty 1

## 2016-04-18 MED ORDER — AMLODIPINE BESYLATE 10 MG PO TABS
10.0000 mg | ORAL_TABLET | Freq: Every day | ORAL | Status: DC
  Filled 2016-04-18: qty 1

## 2016-04-18 MED ORDER — LIDOCAINE HCL (PF) 1 % IJ SOLN
INTRAMUSCULAR | Status: AC
  Filled 2016-04-18: qty 30

## 2016-04-18 MED ORDER — PROTAMINE SULFATE 10 MG/ML IV SOLN
INTRAVENOUS | Status: AC
  Filled 2016-04-18: qty 5

## 2016-04-18 MED ORDER — DOPAMINE-DEXTROSE 3.2-5 MG/ML-% IV SOLN
INTRAVENOUS | Status: AC
  Filled 2016-04-18: qty 250

## 2016-04-18 MED ORDER — LIDOCAINE HCL 4 % EX SOLN
CUTANEOUS | Status: DC | PRN
  Administered 2016-04-18: 2 mL via TOPICAL

## 2016-04-18 MED ORDER — ALUM & MAG HYDROXIDE-SIMETH 200-200-20 MG/5ML PO SUSP: 15.0000 mL | ORAL | Status: DC | PRN

## 2016-04-18 MED ORDER — SODIUM CHLORIDE 0.9 % IV SOLN
INTRAVENOUS | Status: DC
  Administered 2016-04-18: 14:00:00 via INTRAVENOUS

## 2016-04-18 MED ORDER — METOPROLOL TARTRATE 5 MG/5ML IV SOLN: 2.0000 mg | INTRAVENOUS | Status: DC | PRN

## 2016-04-18 MED ORDER — ONDANSETRON HCL 4 MG/2ML IJ SOLN
4.0000 mg | Freq: Four times a day (QID) | INTRAMUSCULAR | Status: DC | PRN
  Filled 2016-04-18: qty 2

## 2016-04-18 MED ORDER — ASPIRIN EC 81 MG PO TBEC
81.0000 mg | DELAYED_RELEASE_TABLET | Freq: Every day | ORAL | Status: DC
  Administered 2016-04-19: 81 mg via ORAL
  Filled 2016-04-18: qty 1

## 2016-04-18 MED ORDER — MAGNESIUM SULFATE 2 GM/50ML IV SOLN: 2.0000 g | Freq: Every day | INTRAVENOUS | Status: DC | PRN

## 2016-04-18 MED ORDER — PHENYLEPHRINE 40 MCG/ML (10ML) SYRINGE FOR IV PUSH (FOR BLOOD PRESSURE SUPPORT)
PREFILLED_SYRINGE | INTRAVENOUS | Status: AC
  Filled 2016-04-18: qty 10

## 2016-04-18 MED ORDER — HYDRALAZINE HCL 20 MG/ML IJ SOLN: 5.0000 mg | INTRAMUSCULAR | Status: DC | PRN

## 2016-04-18 MED ORDER — LIDOCAINE-EPINEPHRINE (PF) 1 %-1:200000 IJ SOLN
INTRAMUSCULAR | Status: AC
  Filled 2016-04-18: qty 30

## 2016-04-18 MED ORDER — ACETAMINOPHEN 650 MG RE SUPP: 325.0000 mg | RECTAL | Status: DC | PRN

## 2016-04-18 MED ORDER — PRAVASTATIN SODIUM 40 MG PO TABS
40.0000 mg | ORAL_TABLET | Freq: Every day | ORAL | Status: DC
  Administered 2016-04-19: 40 mg via ORAL
  Filled 2016-04-18: qty 1

## 2016-04-18 MED ORDER — MORPHINE SULFATE (PF) 2 MG/ML IV SOLN: 2.0000 mg | INTRAVENOUS | Status: DC | PRN

## 2016-04-18 MED ORDER — BSS IO SOLN
15.0000 mL | Freq: Once | INTRAOCULAR | Status: AC
  Administered 2016-04-18: 15 mL
  Filled 2016-04-18: qty 15

## 2016-04-18 MED ORDER — DIPHENHYDRAMINE HCL 25 MG PO CAPS
25.0000 mg | ORAL_CAPSULE | Freq: Every day | ORAL | Status: DC
  Administered 2016-04-18: 25 mg via ORAL
  Filled 2016-04-18: qty 1

## 2016-04-18 MED ORDER — VITAMIN D 1000 UNITS PO TABS
1000.0000 [IU] | ORAL_TABLET | Freq: Two times a day (BID) | ORAL | Status: DC
  Administered 2016-04-18 – 2016-04-19 (×3): 1000 [IU] via ORAL
  Filled 2016-04-18 (×5): qty 1

## 2016-04-18 MED ORDER — ROCURONIUM BROMIDE 50 MG/5ML IV SOSY
PREFILLED_SYRINGE | INTRAVENOUS | Status: AC
  Filled 2016-04-18: qty 5

## 2016-04-18 MED ORDER — LIDOCAINE HCL (CARDIAC) 20 MG/ML IV SOLN
INTRAVENOUS | Status: DC | PRN
  Administered 2016-04-18: 100 mg via INTRAVENOUS

## 2016-04-18 MED ORDER — ENOXAPARIN SODIUM 40 MG/0.4ML ~~LOC~~ SOLN: 40.0000 mg | SUBCUTANEOUS | Status: DC

## 2016-04-18 MED ORDER — GUAIFENESIN-DM 100-10 MG/5ML PO SYRP: 15.0000 mL | ORAL_SOLUTION | ORAL | Status: DC | PRN

## 2016-04-18 MED ORDER — KETOROLAC TROMETHAMINE 0.5 % OP SOLN
1.0000 [drp] | Freq: Three times a day (TID) | OPHTHALMIC | Status: DC | PRN
  Administered 2016-04-18: 1 [drp] via OPHTHALMIC
  Filled 2016-04-18: qty 3

## 2016-04-18 MED ORDER — OXYCODONE-ACETAMINOPHEN 5-325 MG PO TABS
1.0000 | ORAL_TABLET | ORAL | Status: DC | PRN
  Administered 2016-04-18 – 2016-04-19 (×2): 2 via ORAL
  Filled 2016-04-18 (×2): qty 2

## 2016-04-18 MED ORDER — DEXAMETHASONE SODIUM PHOSPHATE 10 MG/ML IJ SOLN
INTRAMUSCULAR | Status: DC | PRN
  Administered 2016-04-18: 10 mg via INTRAVENOUS

## 2016-04-18 MED ORDER — SODIUM CHLORIDE 0.9 % IV SOLN: INTRAVENOUS | Status: DC | PRN

## 2016-04-18 MED ORDER — DEXTROSE 5 % IV SOLN
1.5000 g | INTRAVENOUS | Status: AC
  Administered 2016-04-18: 1.5 g via INTRAVENOUS
  Filled 2016-04-18: qty 1.5

## 2016-04-18 MED ORDER — SUGAMMADEX SODIUM 200 MG/2ML IV SOLN
INTRAVENOUS | Status: DC | PRN
  Administered 2016-04-18: 140 mg via INTRAVENOUS

## 2016-04-18 MED ORDER — DIPHENHYDRAMINE HCL 50 MG/ML IJ SOLN
INTRAMUSCULAR | Status: AC
  Filled 2016-04-18: qty 1

## 2016-04-18 MED ORDER — SODIUM CHLORIDE 0.9 % IV SOLN: 500.0000 mL | Freq: Once | INTRAVENOUS | Status: DC | PRN

## 2016-04-18 MED ORDER — HEPARIN SODIUM (PORCINE) 1000 UNIT/ML IJ SOLN
INTRAMUSCULAR | Status: AC
  Filled 2016-04-18: qty 1

## 2016-04-18 MED ORDER — DEXAMETHASONE SODIUM PHOSPHATE 10 MG/ML IJ SOLN
INTRAMUSCULAR | Status: AC
  Filled 2016-04-18: qty 1

## 2016-04-18 MED ORDER — CHLORHEXIDINE GLUCONATE CLOTH 2 % EX PADS: 6.0000 | MEDICATED_PAD | Freq: Once | CUTANEOUS | Status: DC

## 2016-04-18 MED ORDER — POTASSIUM CHLORIDE CRYS ER 20 MEQ PO TBCR: 20.0000 meq | EXTENDED_RELEASE_TABLET | Freq: Every day | ORAL | Status: DC | PRN

## 2016-04-18 MED ORDER — LABETALOL HCL 5 MG/ML IV SOLN: 10.0000 mg | INTRAVENOUS | Status: DC | PRN

## 2016-04-18 MED ORDER — PROTAMINE SULFATE 10 MG/ML IV SOLN
INTRAVENOUS | Status: DC | PRN
  Administered 2016-04-18 (×4): 10 mg via INTRAVENOUS

## 2016-04-18 MED ORDER — 0.9 % SODIUM CHLORIDE (POUR BTL) OPTIME
TOPICAL | Status: DC | PRN
  Administered 2016-04-18: 2000 mL

## 2016-04-18 MED ORDER — SODIUM CHLORIDE 0.9 % IV SOLN: INTRAVENOUS | Status: DC

## 2016-04-18 MED ORDER — LACTATED RINGERS IV SOLN
INTRAVENOUS | Status: DC | PRN
  Administered 2016-04-18: 08:00:00 via INTRAVENOUS

## 2016-04-18 MED ORDER — BACITRACIN-POLYMYXIN B 500-10000 UNIT/GM OP OINT
TOPICAL_OINTMENT | Freq: Three times a day (TID) | OPHTHALMIC | Status: DC
  Administered 2016-04-18 (×2): via OPHTHALMIC
  Filled 2016-04-18: qty 3.5

## 2016-04-18 MED ORDER — PHENYLEPHRINE HCL 10 MG/ML IJ SOLN
INTRAMUSCULAR | Status: DC | PRN
  Administered 2016-04-18 (×3): 40 ug via INTRAVENOUS

## 2016-04-18 MED ORDER — THROMBIN 20000 UNITS EX SOLR
CUTANEOUS | Status: AC
  Filled 2016-04-18: qty 20000

## 2016-04-18 MED ORDER — DOPAMINE-DEXTROSE 3.2-5 MG/ML-% IV SOLN
0.0000 ug/kg/min | INTRAVENOUS | Status: DC
  Administered 2016-04-18 (×2): 5 ug/kg/min via INTRAVENOUS

## 2016-04-18 MED ORDER — GABAPENTIN 400 MG PO CAPS
800.0000 mg | ORAL_CAPSULE | Freq: Three times a day (TID) | ORAL | Status: DC
  Administered 2016-04-18 – 2016-04-19 (×3): 800 mg via ORAL
  Filled 2016-04-18 (×3): qty 2

## 2016-04-18 MED ORDER — ROCURONIUM BROMIDE 100 MG/10ML IV SOLN
INTRAVENOUS | Status: DC | PRN
  Administered 2016-04-18: 40 mg via INTRAVENOUS

## 2016-04-18 MED ORDER — HEPARIN SODIUM (PORCINE) 1000 UNIT/ML IJ SOLN
INTRAMUSCULAR | Status: DC | PRN
  Administered 2016-04-18: 7000 [IU] via INTRAVENOUS

## 2016-04-18 MED ORDER — ACETAMINOPHEN 325 MG PO TABS: 325.0000 mg | ORAL_TABLET | ORAL | Status: DC | PRN

## 2016-04-18 MED ORDER — PROPOFOL 10 MG/ML IV BOLUS
INTRAVENOUS | Status: DC | PRN
  Administered 2016-04-18: 40 mg via INTRAVENOUS
  Administered 2016-04-18: 100 mg via INTRAVENOUS

## 2016-04-18 SURGICAL SUPPLY — 49 items
ADH SKN CLS APL DERMABOND .7 (GAUZE/BANDAGES/DRESSINGS) ×1
BAG DECANTER FOR FLEXI CONT (MISCELLANEOUS) ×3 IMPLANT
CANISTER SUCTION 2500CC (MISCELLANEOUS) ×3 IMPLANT
CANNULA VESSEL 3MM 2 BLNT TIP (CANNULA) ×7 IMPLANT
CATH ROBINSON RED A/P 18FR (CATHETERS) ×3 IMPLANT
CLIP TI MEDIUM 24 (CLIP) ×3 IMPLANT
CLIP TI WIDE RED SMALL 24 (CLIP) ×3 IMPLANT
CRADLE DONUT ADULT HEAD (MISCELLANEOUS) ×3 IMPLANT
DERMABOND ADVANCED (GAUZE/BANDAGES/DRESSINGS) ×2
DERMABOND ADVANCED .7 DNX12 (GAUZE/BANDAGES/DRESSINGS) ×1 IMPLANT
DRAIN CHANNEL 15F RND FF W/TCR (WOUND CARE) IMPLANT
DRAPE ORTHO SPLIT 77X108 STRL (DRAPES) ×3
DRAPE SURG ORHT 6 SPLT 77X108 (DRAPES) IMPLANT
ELECT REM PT RETURN 9FT ADLT (ELECTROSURGICAL) ×3
ELECTRODE REM PT RTRN 9FT ADLT (ELECTROSURGICAL) ×1 IMPLANT
EVACUATOR SILICONE 100CC (DRAIN) IMPLANT
GLOVE BIO SURGEON STRL SZ 6.5 (GLOVE) ×1 IMPLANT
GLOVE BIO SURGEON STRL SZ7.5 (GLOVE) ×3 IMPLANT
GLOVE BIO SURGEONS STRL SZ 6.5 (GLOVE) ×1
GLOVE BIOGEL PI IND STRL 6.5 (GLOVE) IMPLANT
GLOVE BIOGEL PI IND STRL 8 (GLOVE) ×1 IMPLANT
GLOVE BIOGEL PI INDICATOR 6.5 (GLOVE) ×2
GLOVE BIOGEL PI INDICATOR 8 (GLOVE) ×2
GOWN STRL REUS W/ TWL LRG LVL3 (GOWN DISPOSABLE) ×3 IMPLANT
GOWN STRL REUS W/TWL LRG LVL3 (GOWN DISPOSABLE) ×15
KIT BASIN OR (CUSTOM PROCEDURE TRAY) ×3 IMPLANT
KIT ROOM TURNOVER OR (KITS) ×3 IMPLANT
NDL HYPO 25X1 1.5 SAFETY (NEEDLE) ×1 IMPLANT
NEEDLE HYPO 25X1 1.5 SAFETY (NEEDLE) ×3 IMPLANT
NS IRRIG 1000ML POUR BTL (IV SOLUTION) ×6 IMPLANT
PACK CAROTID (CUSTOM PROCEDURE TRAY) ×3 IMPLANT
PAD ARMBOARD 7.5X6 YLW CONV (MISCELLANEOUS) ×6 IMPLANT
PATCH VASC XENOSURE 1CMX6CM (Vascular Products) ×3 IMPLANT
PATCH VASC XENOSURE 1X6 (Vascular Products) IMPLANT
SHUNT CAROTID BYPASS 10 (VASCULAR PRODUCTS) ×2 IMPLANT
SHUNT CAROTID BYPASS 12FRX15.5 (VASCULAR PRODUCTS) IMPLANT
SPONGE INTESTINAL PEANUT (DISPOSABLE) ×3 IMPLANT
SPONGE SURGIFOAM ABS GEL 100 (HEMOSTASIS) IMPLANT
SUT PROLENE 6 0 BV (SUTURE) ×3 IMPLANT
SUT PROLENE 7 0 BV 1 (SUTURE) ×2 IMPLANT
SUT SILK 2 0 FS (SUTURE) IMPLANT
SUT SILK 3 0 (SUTURE) ×3
SUT SILK 3-0 18XBRD TIE 12 (SUTURE) IMPLANT
SUT VIC AB 3-0 SH 27 (SUTURE) ×3
SUT VIC AB 3-0 SH 27X BRD (SUTURE) ×1 IMPLANT
SUT VICRYL 4-0 PS2 18IN ABS (SUTURE) ×3 IMPLANT
SYR 20CC LL (SYRINGE) ×4 IMPLANT
SYR CONTROL 10ML LL (SYRINGE) ×3 IMPLANT
WATER STERILE IRR 1000ML POUR (IV SOLUTION) ×3 IMPLANT

## 2016-04-18 NOTE — Progress Notes (Signed)
   VASCULAR SURGERY POSTOP CHECK:  Patient apparently had some transient right lower extreme weakness which resolved quickly. This may have simply been related to a cramp area however we did obtain an ultrasound which showed that the carotid endarterectomy site was widely patent.  Currently she has no complaints. She is on some dopamine for her blood pressure.  SUBJECTIVE: No complaints.  PHYSICAL EXAM: Vitals:   04/18/16 1130 04/18/16 1215 04/18/16 1304 04/18/16 1325  BP: (!) 86/63  109/69   Pulse:   (!) 110 94  Resp:   18 17  Temp:   98.1 F (36.7 C)   TempSrc:   Oral   SpO2:  96% 92% 92%  Weight:       NEURO: No focal weakness or paresthesias. Left neck incision looks fine.   LABS: Lab Results  Component Value Date   WBC 11.6 (H) 04/18/2016   HGB 11.8 (L) 04/18/2016   HCT 34.5 (L) 04/18/2016   MCV 95.0 04/18/2016   PLT 217 04/18/2016   Lab Results  Component Value Date   CREATININE 0.90 04/15/2016   Lab Results  Component Value Date   INR 0.97 04/15/2016   CBG (last 3)   Recent Labs  04/18/16 0603  GLUCAP 106*    Active Problems:   Asymptomatic stenosis of left carotid artery    Cari Carawayhris Dawson [place] Beeper: [phone] 04/18/2016

## 2016-04-18 NOTE — Progress Notes (Signed)
*  PRELIMINARY RESULTS* Vascular Ultrasound Limited Left Carotid Duplex (Doppler) has been completed.  The left internal carotid artery is patent without any evidence of hemodynamically significant stenosis, no flap was visualized.   Preliminary results discussed with Lianne CureMaureen Collins, PA-C.  04/18/2016 11:20 AM Gertie FeyMichelle Rocklyn Mayberry, BS, RVT, RDCS, RDMS

## 2016-04-18 NOTE — Anesthesia Procedure Notes (Signed)
Procedure Name: Intubation Date/Time: 04/18/2016 7:47 AM Performed by: Mariea Clonts Pre-anesthesia Checklist: Patient identified, Emergency Drugs available, Suction available and Patient being monitored Patient Re-evaluated:Patient Re-evaluated prior to inductionOxygen Delivery Method: Circle System Utilized Preoxygenation: Pre-oxygenation with 100% oxygen Intubation Type: IV induction Ventilation: Mask ventilation without difficulty Laryngoscope Size: Mac and 4 Grade View: Grade I Tube type: Oral Number of attempts: 1 Airway Equipment and Method: Stylet and Oral airway Placement Confirmation: ETT inserted through vocal cords under direct vision,  positive ETCO2 and breath sounds checked- equal and bilateral Tube secured with: Tape Dental Injury: Teeth and Oropharynx as per pre-operative assessment

## 2016-04-18 NOTE — OR Nursing (Signed)
Pt c/o right eye irritation. Anesthesia notified and new order for eye gtts administered.

## 2016-04-18 NOTE — Interval H&P Note (Signed)
History and Physical Interval Note:  04/18/2016 7:16 AM  Paula HartshornBernice W Fix  has presented today for surgery, with the diagnosis of Left carotid artery stenosis I65.22  The various methods of treatment have been discussed with the patient and family. After consideration of risks, benefits and other options for treatment, the patient has consented to  Procedure(s): ENDARTERECTOMY CAROTID (Left) as a surgical intervention .  The patient's history has been reviewed, patient examined, no change in status, stable for surgery.  I have reviewed the patient's chart and labs.  Questions were answered to the patient's satisfaction.     Waverly Ferrariickson, [place]

## 2016-04-18 NOTE — OR Nursing (Addendum)
Pt c/o right leg numbness and pain at 1050. Pt has equal grips and a slight lag to left side of mouth. BP continue to be in 80s systolic. Dr. Edilia Boickson notified and new orders for duplex of neck placed. PA came to bedside. Duplex was done at approximately 1105 with PA at bedside and was satisfied with results. Dr. Edilia Boickson informed of results by PA.

## 2016-04-18 NOTE — Transfer of Care (Deleted)
Immediate Anesthesia Transfer of Care Note  Patient: Paula Gomez  Procedure(s) Performed: Procedure(s): LEFT CAROTID ENDARTERECTOMY (Left)  Patient Location: PACU  Anesthesia Type:General  Level of Consciousness: awake, alert  and oriented  Airway & Oxygen Therapy: Patient Spontanous Breathing and Patient connected to nasal cannula oxygen  Post-op Assessment: Report given to RN, Post -op Vital signs reviewed and stable, Patient moving all extremities X 4 and Patient able to stick tongue midline  Post vital signs: Reviewed and stable  Last Vitals:  Vitals:   04/18/16 0547  BP: 111/90  Pulse: 94  Resp: 20  Temp: 36.8 C    Last Pain:  Vitals:   04/18/16 0547  TempSrc: Oral      Patients Stated Pain Goal: 1 (04/18/16 0555)  Complications: No apparent anesthesia complications

## 2016-04-18 NOTE — H&P (View-Only) (Signed)
Patient name: Paula Gomez MRN: [id number] DOB: [birth year] Sex: female  REASON FOR CONSULT: Bilateral carotid disease. Reffered by the [facility].  HPI: Paula Gomez is a 61 y.o. female, who had some abnormalities on an ophthalmologic exam which prompted a carotid duplex scan. She says she had some retinal hemorrhages bilaterally. Her carotid duplex scan showed an occluded right internal carotid artery with a greater than 70% left carotid stenosis.  She is right-handed. She denies any history of stroke, TIAs, expressive or receptive aphasia.  She does smoke a pack per day of cigarettes and has been smoking for 47 years.   Her risk factors for peripheral vascular disease include borderline diabetes, hypertension, and tobacco use.  I have reviewed the records that were sent from the [place]. The patient was evaluated with bilateral carotid disease. Given these findings, aspirin (81 mg), and a statin were added (Pravastatin 40 mg daily).  Past Medical History:  Diagnosis Date  . Stroke [facility])     No family history on file. She denies any family history of premature cardiovascular disease.  SOCIAL HISTORY: Social History   Social History  . Marital status: Divorced    Spouse name: N/A  . Number of children: N/A  . Years of education: N/A   Occupational History  . Not on file.   Social History Main Topics  . Smoking status: Current Every Day Smoker  . Smokeless tobacco: Never Used  . Alcohol use Yes     Comment: all alcohol daily  . Drug use: Yes    Types: Marijuana, Cocaine     Comment: last used cocaine last night  . Sexual activity: Not on file   Other Topics Concern  . Not on file   Social History Narrative  . No narrative on file    No Known Allergies  Current Outpatient Prescriptions  Medication Sig Dispense Refill  . amLODipine (NORVASC) 10 MG tablet Take 0.5 tablets (5 mg total) by mouth daily.    Marland Kitchen aspirin EC 81 MG tablet Take 81 mg by mouth  daily.    . CHOLECALCIFEROL PO Take 1,000 Units by mouth 2 (two) times daily.    . cyclobenzaprine (FLEXERIL) 10 MG tablet Take 5 mg by mouth 2 (two) times daily as needed for muscle spasms.    Marland Kitchen gabapentin (NEURONTIN) 400 MG capsule Take 400 mg by mouth 3 (three) times daily.    . hydrochlorothiazide (HYDRODIURIL) 25 MG tablet Take 25 mg by mouth daily.    Marland Kitchen levofloxacin (LEVAQUIN) 750 MG tablet Take 1 tablet (750 mg total) by mouth daily. 10 tablet 0  . lisinopril (PRINIVIL,ZESTRIL) 20 MG tablet Take 10 mg by mouth daily.    . pravastatin (PRAVACHOL) 40 MG tablet Take 40 mg by mouth daily.    Marland Kitchen ibuprofen (ADVIL,MOTRIN) 600 MG tablet Take 600 mg by mouth 3 (three) times daily as needed for mild pain.     No current facility-administered medications for this visit.     REVIEW OF SYSTEMS:  [X]  denotes positive finding, [ ]  denotes negative finding Cardiac  Comments:  Chest pain or chest pressure:    Shortness of breath upon exertion:    Short of breath when lying flat:    Irregular heart rhythm:        Vascular    Pain in calf, thigh, or hip brought on by ambulation: X   Pain in feet at night that wakes you up from your sleep:  X  Blood clot in your veins:    Leg swelling:         Pulmonary    Oxygen at home:    Productive cough:     Wheezing:         Neurologic    Sudden weakness in arms or legs:     Sudden numbness in arms or legs:     Sudden onset of difficulty speaking or slurred speech:    Temporary loss of vision in one eye:     Problems with dizziness:         Gastrointestinal    Blood in stool:     Vomited blood:         Genitourinary    Burning when urinating:     Blood in urine:        Psychiatric    Major depression:         Hematologic    Bleeding problems:    Problems with blood clotting too easily:        Skin    Rashes or ulcers:        Constitutional    Fever or chills:      PHYSICAL EXAM: Vitals:   04/05/16 0927 04/05/16 0929  BP:  132/87 110/82  Pulse: 94 94  Resp: 16   Temp: (!) 96.6 F (35.9 C)   SpO2: 99%   Weight: 157 lb (71.2 kg)   Height: 5\' 8"  (1.727 m)     GENERAL: The patient is a well-nourished female, in no acute distress. The vital signs are documented above. CARDIAC: There is a regular rate and rhythm.  VASCULAR: She has a left carotid bruit. On the right side, she has a palpable femoral, popliteal, and dorsalis pedis pulse. On the left side she has a palpable femoral pulse. She has a diminished popliteal pulses and diminished dorsalis pedis pulse. She has no significant lower extremity swelling. PULMONARY: There is good air exchange bilaterally without wheezing or rales. ABDOMEN: Soft and non-tender with normal pitched bowel sounds.  MUSCULOSKELETAL: There are no major deformities or cyanosis. NEUROLOGIC: No focal weakness or paresthesias are detected. SKIN: There are no ulcers or rashes noted. PSYCHIATRIC: The patient has a normal affect.  DATA:   CT ANGIOGRAM: I reviewed the images of the CT angiogram of the neck. This shows a focal tight proximal left internal carotid artery stenosis with an occluded right internal carotid artery. There is also some moderate disease in the proximal left common carotid artery but no focal critical stenosis.  CAROTID DUPLEX: I reviewed the carotid duplex scan that was done in [place]. This shows an occluded right internal carotid artery with a greater than 80% left carotid stenosis by velocity criteria.  CAROTID DUPLEX: I have independently interpreted the carotid duplex scan in our office today. This confirms that the right internal carotid artery is occluded. The left internal carotid artery has a critical greater than 80% stenosis. Peak systolic velocity is 582 cm/s with end-diastolic velocity of 296 cm/s.  MEDICAL ISSUES:   BILATERAL CAROTID DISEASE: This patient has an occluded right internal carotid artery with this greater than 80% left carotid  stenosis. I have recommended left carotid endarterectomy in order to lower her risk of future stroke. We have discussed the importance of tobacco cessation. In addition she is now on aspirin and a statin and she knows to continue this perioperatively.  I have reviewed the indications for carotid endarterectomy, that is to lower the risk of future  stroke. I have also reviewed the potential complications of surgery, including but not limited to: bleeding, stroke (perioperative risk 1-2%), MI, nerve injury of other unpredictable medical problems. All of the patients questions were answered and they are agreeable to proceed with surgery. Her surgery is scheduled for 04/18/2016.    Waverly Ferrariickson, Kala Ambriz [facility] [place] [phone]

## 2016-04-18 NOTE — Op Note (Signed)
    NAME: Paula Gomez   MRN: [id number] DOB: Oct 16, 1955    DATE OF OPERATION: 04/18/2016  PREOP DIAGNOSIS: Greater than 80% left carotid stenosis  POSTOP DIAGNOSIS: Same  PROCEDURE: Left carotid endarterectomy with bovine pericardial patch angioplasty  SURGEON: Di Kindlehristopher S. Edilia Boickson, MD, FACS  ASSIST: Karsten RoKim Trinh, [facility]  ANESTHESIA: Gen.   EBL: Minimal  INDICATIONS: Paula Gomez is a 61 y.o. female who had a carotid duplex scan which showed an occluded right internal carotid artery with an 80% left carotid stenosis. Left carotid endarterectomy was recommended in order to lower her risk of future stroke.  FINDINGS: Focal 90% left carotid stenosis  TECHNIQUE: The patient was taken to the operating room after an arterial line was placed by anesthesia. The patient received a general anesthetic. The left neck was prepped and draped in the usual sterile fashion. An incision was made along the anterior border of the sternocleidomastoid and dissection carried down to the common carotid artery which was dissected free and controlled with Rummel tourniquet. The facial vein was divided between 2-0 silk ties. The internal carotid artery was controlled above the plaque and the superior thyroid artery and external carotid arteries were controlled. The patient was heparinized. Clamps were then placed on the internal then the common and then the external carotid artery. A longitudinal arteriotomy was made in the common carotid artery and this was extended through the plaque into the normal internal carotid artery above the plaque. Attention was placed into the internal carotid artery, backbled and then placed into the common carotid artery and secured with a Rummel tourniquet. Flow was reestablished to the shunt. An endarterectomy plane was established proximally and the plaque was sharply divided. Eversion endarterectomy was performed of the external carotid artery. Distally there was a nice taper in the  plaque and no tacking sutures were required. The artery was irrigated with copious amounts of heparin and dextran and all loose debris removed. A bovine pericardial patch was then sewn using continuous 6-0 Prolene suture. Prior to completing this patch closure the shunt was removed. The arteries were backbled and flushed appropriately and the anastomosis completed. Flow was reestablished first to the external carotid artery then into the internal carotid artery. At the completion was an excellent internal carotid signal with the Doppler with good diastolic flow. The heparin was partially reversed with protamine. The wound was closed with deep layer of 3-0 Vicryl, the platysma closed with running 3-0 Vicryl, and the skin closed with 4-0 Vicryl. The patient awoke neurologically intact. Dermabond was applied. Patient tolerated procedure well and was transferred to the recovery room in stable condition.    Waverly Ferrarihristopher Dickson, MD, FACS [facility]  DATE OF DICTATION:   04/18/2016

## 2016-04-18 NOTE — Transfer of Care (Signed)
Immediate Anesthesia Transfer of Care Note  Patient: Paula Gomez  Procedure(s) Performed: Procedure(s): LEFT CAROTID ENDARTERECTOMY (Left)  Patient Location: PACU  Anesthesia Type:General  Level of Consciousness: awake, alert  and oriented  Airway & Oxygen Therapy: Patient Spontanous Breathing and Patient connected to nasal cannula oxygen  Post-op Assessment: Report given to RN, Post -op Vital signs reviewed and stable, Patient moving all extremities X 4 and Patient able to stick tongue midline  Post vital signs: Reviewed and stable  Last Vitals:  Vitals:   04/18/16 0547  BP: 111/90  Pulse: 94  Resp: 20  Temp: 36.8 C    Last Pain:  Vitals:   04/18/16 0547  TempSrc: Oral      Patients Stated Pain Goal: 1 (04/18/16 0555)  Complications: No apparent anesthesia complications 

## 2016-04-18 NOTE — Progress Notes (Signed)
     Called to bedside in PACU CC;right leg pain and trouble moving her leg.  She states her leg gets like this when she ambulates to long or stands for a long time.  She also has a left facial droop, but her speech is clear and she is alert and oriented x 3.   Bilateral UE grip 5/5, sensation.  Right LE lateral leg pain and decreased motor to lift her leg.  Sensation intact.  I massaged her leg and did passive range of motion which she then stated it felt better.  I feel the patient was having leg cramps.  Doppler signals PT/DP/peroneal right LE  PRELIMINARY RESULTS* Vascular Ultrasound Limited Left Carotid Duplex (Doppler) has been completed.  The left internal carotid artery is patent without any evidence of hemodynamically significant stenosis, no flap was visualized.   Preliminary results discussed with Lianne CureMaureen Collins, PA-C.

## 2016-04-18 NOTE — Anesthesia Postprocedure Evaluation (Signed)
Anesthesia Post Note  Patient: Catarina HartshornBernice W Kader  Procedure(s) Performed: Procedure(s) (LRB): LEFT CAROTID ENDARTERECTOMY (Left)  Patient location during evaluation: PACU Anesthesia Type: General Level of consciousness: sedated and patient cooperative Pain management: pain level controlled Vital Signs Assessment: post-procedure vital signs reviewed and stable Respiratory status: spontaneous breathing Cardiovascular status: stable Anesthetic complications: no       Last Vitals:  Vitals:   04/18/16 1400 04/18/16 1500  BP: 97/71 113/73  Pulse: 73 79  Resp: 13 13  Temp:      Last Pain:  Vitals:   04/18/16 1304  TempSrc: Oral  PainSc:                  Lewie LoronJohn Shamar Kracke

## 2016-04-19 ENCOUNTER — Encounter (HOSPITAL_COMMUNITY): Payer: Self-pay | Admitting: Vascular Surgery

## 2016-04-19 LAB — BASIC METABOLIC PANEL
Anion gap: 11 (ref 5–15)
BUN: 12 mg/dL (ref 6–20)
CHLORIDE: 104 mmol/L (ref 101–111)
CO2: 18 mmol/L — ABNORMAL LOW (ref 22–32)
Calcium: 8.8 mg/dL — ABNORMAL LOW (ref 8.9–10.3)
Creatinine, Ser: 0.92 mg/dL (ref 0.44–1.00)
GFR calc Af Amer: >60 mL/min (ref >60)
GFR calc non Af Amer: >60 mL/min (ref >60)
GLUCOSE: 172 mg/dL — AB (ref 65–99)
POTASSIUM: 4.2 mmol/L (ref 3.5–5.1)
Sodium: 133 mmol/L — ABNORMAL LOW (ref 135–145)

## 2016-04-19 LAB — CBC
HEMATOCRIT: 31 % — AB (ref 36.0–46.0)
Hemoglobin: 10.6 g/dL — ABNORMAL LOW (ref 12.0–15.0)
MCH: 32.1 pg (ref 26.0–34.0)
MCHC: 34.2 g/dL (ref 30.0–36.0)
MCV: 93.9 fL (ref 78.0–100.0)
Platelets: 213 10*3/uL (ref 150–400)
RBC: 3.3 MIL/uL — AB (ref 3.87–5.11)
RDW: 12.9 % (ref 11.5–15.5)
WBC: 17.9 10*3/uL — ABNORMAL HIGH (ref 4.0–10.5)

## 2016-04-19 MED ORDER — AMLODIPINE BESYLATE 10 MG PO TABS: 10.0000 mg | ORAL_TABLET | Freq: Every day | ORAL | Status: AC

## 2016-04-19 MED ORDER — OXYCODONE-ACETAMINOPHEN 5-325 MG PO TABS: 1.0000 | ORAL_TABLET | ORAL | 0 refills | Status: DC | PRN

## 2016-04-19 NOTE — Care Management Note (Signed)
Case Management Note  Patient Details  Name: Catarina HartshornBernice W Coulson MRN: [id number] Date of Birth: [birth year]  Subjective/Objective:  S/p L CEA, for dc today, no needs.                   Action/Plan:   Expected Discharge Date:  04/19/16               Expected Discharge Plan:  Home/Self Care  In-House Referral:     Discharge planning Services  CM Consult  Post Acute Care Choice:    Choice offered to:     DME Arranged:    DME Agency:     HH Arranged:    HH Agency:     Status of Service:  Completed, signed off  If discussed at [employer] Length of Stay Meetings, dates discussed:    Additional Comments:  Leone Havenaylor, Jayni Prescher [place], RN 04/19/2016, 10:36 AM

## 2016-04-19 NOTE — Progress Notes (Signed)
Pt discharged per MD order, all instructions reviewed and all questions answered. Incision clean dry and intact without signs of infection

## 2016-04-19 NOTE — Progress Notes (Addendum)
  [facility] Progress Note  Subjective  - POD #1  Doing well this am. No complaints.   Objective Vitals:   04/19/16 0400 04/19/16 0500  BP: 94/68 98/64  Pulse: 76 72  Resp: 13 (!) 24  Temp: 97.9 F (36.6 C)     Intake/Output Summary (Last 24 hours) at 04/19/16 0725 Last data filed at 04/19/16 0500  Gross per 24 hour  Intake          4554.31 ml  Output             2050 ml  Net          2504.31 ml   Left neck incision no hematoma.  No smile asymmetry. Tongue midline. Moving all extremities equally.   Assessment/Planning: 61 y.o. female is s/p: left carotid endarterectomy 1 Day Post-Op   Neuro exam intact.  Dopamine turned off this am around 0600. SBP this am 90s.  Neck incision without hematoma.  Has ambulated. Tolerating diet without difficulty.  D/c home later today if blood pressure remains stable off of dopamine.   Raymond GurneyKimberly A Trinh 04/19/2016 7:25 AM --  Laboratory CBC    Component Value Date/Time   WBC 17.9 (H) 04/19/2016 0227   HGB 10.6 (L) 04/19/2016 0227   HCT 31.0 (L) 04/19/2016 0227   PLT 213 04/19/2016 0227    BMET    Component Value Date/Time   NA 133 (L) 04/19/2016 0227   K 4.2 04/19/2016 0227   CL 104 04/19/2016 0227   CO2 18 (L) 04/19/2016 0227   GLUCOSE 172 (H) 04/19/2016 0227   BUN 12 04/19/2016 0227   CREATININE 0.92 04/19/2016 0227   CALCIUM 8.8 (L) 04/19/2016 0227   GFRNONAA >60 04/19/2016 0227   GFRAA >60 04/19/2016 0227    COAG Lab Results  Component Value Date   INR 0.97 04/15/2016   No results found for: PTT  Antibiotics Anti-infectives    Start     Dose/Rate Route Frequency Ordered Stop   04/18/16 1600  cefUROXime (ZINACEF) 1.5 g in dextrose 5 % 50 mL IVPB     1.5 g 100 mL/hr over 30 Minutes Intravenous Every 12 hours 04/18/16 1311 04/19/16 0630   04/18/16 0524  cefUROXime (ZINACEF) 1.5 g in dextrose 5 % 50 mL IVPB     1.5 g 100 mL/hr over 30 Minutes Intravenous 30 min pre-op 04/18/16 0524  04/18/16 0753       Maris BergerKimberly Trinh, PA-C [facility] Office: [phone] Pager: [phone] 04/19/2016 7:25 AM  I have interviewed the patient and examined the patient. I agree with the findings by the PA. For discharge today. She will hold her BP meds today and resume tomorrow. She knows to check her BP at home and follow up with her PMD if her BP is low. She is on ASA and is on a statin. We again discussed the importance of tobacco cessation.   Cari Carawayhris Darral Rishel, MD [phone]

## 2016-04-22 NOTE — Discharge Summary (Signed)
[facility] Discharge Summary  AHRI OLSON 04-20-1955 61 y.o. female  [id number]  Admission Date: 04/18/2016  Discharge Date: 04/19/2016  Physician: Waverly Ferrari, MD  Admission Diagnosis: Left carotid artery stenosis I65.22  HPI:   This is a 61 y.o. female who had some abnormalities on an ophthalmologic exam which prompted a carotid duplex scan. She says she had some retinal hemorrhages bilaterally. Her carotid duplex scan showed an occluded right internal carotid artery with a greater than 70% left carotid stenosis.  She is right-handed. She denies any history of stroke, TIAs, expressive or receptive aphasia.  She does smoke a pack per day of cigarettes and has been smoking for 47 years.   Her risk factors for peripheral vascular disease include borderline diabetes, hypertension, and tobacco use.  Dr. Edilia Bo reviewed the records that were sent from the [place]. The patient was evaluated with bilateral carotid disease. Given these findings, aspirin (81 mg), and a statin were added (Pravastatin 40 mg daily).  Hospital Course:  The patient was admitted to the hospital and taken to the operating room on 04/18/2016 and underwent left carotid endarterectomy.  The patient tolerated the procedure well and was transported to the PACU in stable condition.  In the recovery room, the patient complained of right leg pain and trouble moving her leg. She felt like she was having leg cramps. She stated that her leg feels like this when she ambulates too long or stands for a long time. She also had an left sided (ipsilateral) facial droop. She had 5/5 bilateral upper extremity strength. Her right leg had decreased motor function, but sensation was intact. Her carotid duplex revealed a patent left internal carotid artery without any flap visualized.  She was placed on dopamine for soft blood pressures in the 80s.   By POD 1, the patient's neuro status was intact. The  patient felt that her right leg pain and weakness was resolved. She felt that it was a cramp. Her dopamine was turned off around 0600 and her SBP was in the 90s. Her BP meds were held. Her neck incision was without hematoma. Her   Discharge Instructions:   The patient is discharged to home with extensive instructions on wound care and progressive ambulation.  They are instructed not to drive or perform any heavy lifting until returning to see the physician in his office.  Discharge Instructions    CAROTID Sugery: Call MD for difficulty swallowing or speaking; weakness in arms or legs that is a new symtom; severe headache.  If you have increased swelling in the neck and/or  are having difficulty breathing, CALL 911    Complete by:  As directed    Call MD for:  redness, tenderness, or signs of infection (pain, swelling, bleeding, redness, odor or green/yellow discharge around incision site)    Complete by:  As directed    Call MD for:  severe or increased pain, loss or decreased feeling  in affected limb(s)    Complete by:  As directed    Call MD for:  temperature >100.5    Complete by:  As directed    Discharge wound care:    Complete by:  As directed    Wash wound daily with soap and water and pat dry. Do not apply any creams or ointments on your incisions. Do not pull off the skin glue. It will peel off on its own.   Driving Restrictions    Complete by:  As directed  No driving for 2 weeks.   Increase activity slowly    Complete by:  As directed    Walk with assistance use walker or cane as needed   Lifting restrictions    Complete by:  As directed    No lifting for 2 weeks   Resume previous diet    Complete by:  As directed       Discharge Diagnosis:  Left carotid artery stenosis I65.22  Secondary Diagnosis: Patient Active Problem List   Diagnosis Date Noted  . Asymptomatic stenosis of left carotid artery 04/18/2016  . Pyelonephritis 12/28/2014  . Hypertension 12/28/2014    . Hyponatremia 12/28/2014  . Hypokalemia 12/28/2014  . Alcohol abuse 12/28/2014  . Tobacco abuse 12/28/2014  . Macrocytic anemia 12/28/2014  . Gram-negative bacteremia 12/28/2014  . CLOSED FRACTURE OF NAVICULAR BONE OF FOOT 07/22/2007  . OPEN FRACTURE OF NAVICULAR BONE OF FOOT 07/22/2007   Past Medical History:  Diagnosis Date  . Arthritis   . Complication of anesthesia    recent reaction to versed and fentyl after colonoscopy swellin in face and lips  . Hepatitis    past diagnosis Hep C,pt states that she is now Clear  . Hypertension   . Pre-diabetes     Allergies as of 04/19/2016      Reactions   Fentanyl Swelling   Swelling of face and lips   Versed [midazolam] Swelling   Swelling of face and lips      Medication List    TAKE these medications   amLODipine 10 MG tablet Commonly known as:  NORVASC Take 1 tablet (10 mg total) by mouth daily.   aspirin EC 81 MG tablet Take 81 mg by mouth daily.   Cholecalciferol 1000 units tablet Take 1,000 Units by mouth 2 (two) times daily.   diphenhydrAMINE 25 mg capsule Commonly known as:  BENADRYL Take 25 mg by mouth at bedtime.   EPINEPHrine 0.3 mg/0.3 mL Soaj injection Commonly known as:  EPI-PEN Inject 0.3 mg into the muscle once. For Bee stings   gabapentin 400 MG capsule Commonly known as:  NEURONTIN Take 800 mg by mouth 3 (three) times daily.   hydrochlorothiazide 25 MG tablet Commonly known as:  HYDRODIURIL Take 25 mg by mouth daily.   lisinopril 10 MG tablet Commonly known as:  PRINIVIL,ZESTRIL Take 1 tablet by mouth daily.   oxyCODONE-acetaminophen 5-325 MG tablet Commonly known as:  PERCOCET/ROXICET Take 1-2 tablets by mouth every 4 (four) hours as needed for moderate pain.   pravastatin 40 MG tablet Commonly known as:  PRAVACHOL Take 40 mg by mouth daily.       Percocet #8 No Refill  Disposition: home  Patient's condition: is Good  Follow up: 1. Dr.  Edilia Boickson in 2 weeks.   Maris BergerKimberly  Yandel Zeiner, PA-C [facility] [phone]  --- For [facility] use --- Instructions: Press F2 to tab through selections.  Delete question if not applicable.   Modified Rankin score at D/C (0-6): 0  IV medication needed for:  1. Hypertension: No 2. Hypotension: Yes  Post-op Complications: No  1. Post-op CVA or TIA: No   2. CN injury: No  3. Myocardial infarction: No  4.  CHF: No  5.  Dysrhythmia (new): No  6. Wound infection: No  7. Reperfusion symptoms: No  8. Return to OR: No  Discharge medications: Statin use:  Yes If No: [ ]  For Medical reasons, [ ]  Non-compliant, [ ]  Not-indicated ASA use:  Yes  If No: [ ]  For Medical reasons, [ ]  Non-compliant, [ ]  Not-indicated Beta blocker use:  No If No: [ ]  For Medical reasons, [ ]  Non-compliant, [x ] Not-indicated ACE-Inhibitor use:  Yes If No: [ ]  For Medical reasons, [ ]  Non-compliant, [x ] Not-indicated P2Y12 Antagonist use: No, [ ]  Plavix, [ ]  Plasugrel, [ ]  Ticlopinine, [ ]  Ticagrelor, [ ]  Other, [ ]  No for medical reason, [ ]  Non-compliant, [ x] Not-indicated Anti-coagulant use:  No, [ ]  Warfarin, [ ]  Rivaroxaban, [ ]  Dabigatran, [ ]  Other, [ ]  No for medical reason, [ ]  Non-compliant, [x ] Not-indicated

## 2016-04-25 ENCOUNTER — Encounter: Payer: Self-pay | Admitting: Vascular Surgery

## 2016-05-01 ENCOUNTER — Encounter: Payer: Self-pay | Admitting: Vascular Surgery

## 2016-05-01 ENCOUNTER — Ambulatory Visit (INDEPENDENT_AMBULATORY_CARE_PROVIDER_SITE_OTHER): Payer: Self-pay | Admitting: Vascular Surgery

## 2016-05-01 VITALS — BP 125/89 | HR 100 | Temp 97.9°F | Resp 18 | Ht 68.0 in | Wt 159.7 lb

## 2016-05-01 DIAGNOSIS — I6523 Occlusion and stenosis of bilateral carotid arteries: Secondary | ICD-10-CM

## 2016-05-01 NOTE — Progress Notes (Signed)
Subjective:     Patient ID: Paula HartshornBernice W Tolley, female   DOB: 1955/04/21, 61 y.o.   MRN: [id number]  HPI 61 yo female recent carotid endarterectomy for high-grade stenosis on the left. She also has occlusion on the right. She has not had issues since surgery is never had stroke or TIA. Her only complaint is thickening of her skin on her left neck with associated numbness which is not having pain or neurologic symptoms.   Review of Systems Thickened area of scar Numbness of neck    Objective:   Physical Exam aaox3 Cranial nerves in tact Incision cdi with induration Moving all 4 extremities with 5/5 strength    Assessment/plan     61 year old female status post left carotid endarterectomy. She has a known occlusion of the right carotid. She is not having neurologic symptoms and is generally doing well since surgery. She will follow up in 6 months with Dr. Edilia Boickson with carotid duplex. Should she have issues between now and then we can certainly see her sooner.    Chanise Habeck C. Randie Heinzain, MD [facility] [place] Office: [phone] Pager: [phone]

## 2016-05-09 NOTE — Addendum Note (Signed)
Addended by: Burton ApleyPETTY, Taaliyah Delpriore A on: 05/09/2016 12:31 PM   Modules accepted: Orders

## 2016-10-29 ENCOUNTER — Encounter: Payer: Self-pay | Admitting: Vascular Surgery

## 2016-11-06 ENCOUNTER — Encounter: Payer: Self-pay | Admitting: Vascular Surgery

## 2016-11-06 ENCOUNTER — Ambulatory Visit (INDEPENDENT_AMBULATORY_CARE_PROVIDER_SITE_OTHER): Payer: Non-veteran care | Admitting: Vascular Surgery

## 2016-11-06 ENCOUNTER — Ambulatory Visit (HOSPITAL_COMMUNITY)
Admission: RE | Admit: 2016-11-06 | Discharge: 2016-11-06 | Disposition: A | Discharge status: Home / Self Care | Payer: Non-veteran care | Source: Ambulatory Visit | Attending: Vascular Surgery | Admitting: Vascular Surgery

## 2016-11-06 VITALS — BP 118/84 | HR 91 | Temp 97.0°F | Ht 68.5 in | Wt 161.0 lb

## 2016-11-06 DIAGNOSIS — I6523 Occlusion and stenosis of bilateral carotid arteries: Secondary | ICD-10-CM

## 2016-11-06 LAB — VAS US CAROTID
LCCADSYS: 80 cm/s
LEFT ECA DIAS: -19 cm/s
LICAPDIAS: -23 cm/s
Left CCA dist dias: 23 cm/s
Left CCA prox dias: 34 cm/s
Left CCA prox sys: 94 cm/s
Left ICA dist dias: -51 cm/s
Left ICA dist sys: -104 cm/s
Left ICA prox sys: -62 cm/s
RCCAPDIAS: 8 cm/s
RCCAPSYS: 54 cm/s
RIGHT CCA MID DIAS: 13 cm/s
RIGHT ECA DIAS: -26 cm/s

## 2016-11-06 NOTE — Progress Notes (Signed)
Patient name: Paula Gomez MRN: [id number] DOB: 1955/11/12 Sex: female  REASON FOR VISIT:    Follow up of bilateral carotid disease.  HPI:   Paula Gomez is a pleasant 61 y.o. female who has a known right internal carotid artery occlusion and had an 80% left carotid stenosis. Left carotid endarterectomy was recommended in order to lower her risk of future stroke. She had a left carotid endarterectomy on 04/18/2016. She comes in for a 6 month follow up visit.  Since I saw her last, she denies any history of stroke, TIAs, expressive or receptive aphasia, or amaurosis fugax. She is on aspirin and is on a statin. She has cut back to a third of a pack per cigarettes per day.  She describes some pain along the lateral aspect of her right thigh but this does not sound like claudication. She also has some degenerative disc disease of her back and back pain. I suspect that this is referred pain.  Past Medical History:  Diagnosis Date  . Arthritis   . Complication of anesthesia    recent reaction to versed and fentyl after colonoscopy swellin in face and lips  . Hepatitis    past diagnosis Hep C,pt states that she is now Clear  . Hypertension   . Pre-diabetes     No family history on file.  There is no family history of premature cardiovascular disease.  SOCIAL HISTORY: Social History  Substance Use Topics  . Smoking status: Current Every Day Smoker    Packs/day: 0.25    Years: 45.00  . Smokeless tobacco: Former Neurosurgeon    Types: Chew    Quit date: 1975     Comment: pt. states that she is quitting,down to about 4 cigarettes a day  . Alcohol use No     Comment: no longer drinks alochol    Allergies  Allergen Reactions  . Fentanyl Swelling    Swelling of face and lips  . Versed [Midazolam] Swelling    Swelling of face and lips    Current Outpatient Prescriptions  Medication Sig Dispense Refill  . aspirin EC 81 MG tablet Take 81 mg by mouth daily.    . Cholecalciferol  1000 units tablet Take 1,000 Units by mouth 2 (two) times daily.    . diphenhydrAMINE (BENADRYL) 25 mg capsule Take 25 mg by mouth at bedtime.    Marland Kitchen EPINEPHrine 0.3 mg/0.3 mL IJ SOAJ injection Inject 0.3 mg into the muscle once. For Bee stings    . gabapentin (NEURONTIN) 400 MG capsule Take 800 mg by mouth 3 (three) times daily.     . hydrochlorothiazide (HYDRODIURIL) 25 MG tablet Take 25 mg by mouth daily.    Marland Kitchen lisinopril (PRINIVIL,ZESTRIL) 10 MG tablet Take 1 tablet by mouth daily.    . pravastatin (PRAVACHOL) 40 MG tablet Take 40 mg by mouth daily.    Marland Kitchen amLODipine (NORVASC) 10 MG tablet Take 1 tablet (10 mg total) by mouth daily. (Patient not taking: Reported on 11/06/2016)    . oxyCODONE-acetaminophen (PERCOCET/ROXICET) 5-325 MG tablet Take 1-2 tablets by mouth every 4 (four) hours as needed for moderate pain. (Patient not taking: Reported on 11/06/2016) 8 tablet 0   No current facility-administered medications for this visit.     REVIEW OF SYSTEMS:  [X]  denotes positive finding, [ ]  denotes negative finding Cardiac  Comments:  Chest pain or chest pressure:    Shortness of breath upon exertion:    Short of breath when lying  flat:    Irregular heart rhythm:        Vascular    Pain in calf, thigh, or hip brought on by ambulation: X   Pain in feet at night that wakes you up from your sleep:  X   Blood clot in your veins:    Leg swelling:         Pulmonary    Oxygen at home:    Productive cough:     Wheezing:         Neurologic    Sudden weakness in arms or legs:     Sudden numbness in arms or legs:     Sudden onset of difficulty speaking or slurred speech:    Temporary loss of vision in one eye:     Problems with dizziness:         Gastrointestinal    Blood in stool:     Vomited blood:         Genitourinary    Burning when urinating:     Blood in urine:        Psychiatric    Major depression:         Hematologic    Bleeding problems:    Problems with blood clotting  too easily:        Skin    Rashes or ulcers:        Constitutional    Fever or chills:     PHYSICAL EXAM:   Vitals:   11/06/16 1300  BP: 118/84  Pulse: 91  Temp: (!) 97 F (36.1 C)  TempSrc: Oral  SpO2: 97%  Weight: 161 lb (73 kg)  Height: 5' 8.5" (1.74 m)    GENERAL: The patient is a well-nourished female, in no acute distress. The vital signs are documented above. CARDIAC: There is a regular rate and rhythm.  VASCULAR: I do not detect carotid bruits. She has palpable femoral pulses bilaterally. She has a palpable right dorsalis pedis pulse. I cannot palpate pedal pulses on the left although both feet are warm and well-perfused. She has no significant lower extremity swelling. PULMONARY: There is good air exchange bilaterally without wheezing or rales. ABDOMEN: Soft and non-tender with normal pitched bowel sounds.  MUSCULOSKELETAL: There are no major deformities or cyanosis. NEUROLOGIC: No focal weakness or paresthesias are detected. SKIN: There are no ulcers or rashes noted. PSYCHIATRIC: The patient has a normal affect.  DATA:    CAROTID DUPLEX: I have independently interpreted her carotid duplex scan today. She has the known right internal carotid artery occlusion. The left carotid endarterectomy site is widely patent without evidence of restenosis.  MEDICAL ISSUES:   STATUS POST LEFT CAROTID ENDARTERECTOMY: The patient is doing well now 6 months status post left carotid endarterectomy. She has a known right internal carotid artery occlusion. The left carotid endarterectomy site is widely patent. She is on aspirin and is on a statin. We have again discussed the importance of tobacco cessation. I will order a follow up carotid duplex scan 6 months and I'll see her back at that time. If the duplex scan looks good at that time then we will stretch her follow up out to 1 year.  Waverly Ferrari [facility] [place] [phone]

## 2016-11-07 NOTE — Addendum Note (Signed)
Addended by: Burton Apley A on: 11/07/2016 12:54 PM   Modules accepted: Orders

## 2017-05-14 ENCOUNTER — Ambulatory Visit (INDEPENDENT_AMBULATORY_CARE_PROVIDER_SITE_OTHER): Payer: Non-veteran care | Admitting: Vascular Surgery

## 2017-05-14 ENCOUNTER — Encounter: Payer: Self-pay | Admitting: Vascular Surgery

## 2017-05-14 ENCOUNTER — Other Ambulatory Visit: Payer: Self-pay

## 2017-05-14 ENCOUNTER — Ambulatory Visit (HOSPITAL_COMMUNITY)
Admission: RE | Admit: 2017-05-14 | Discharge: 2017-05-14 | Disposition: A | Discharge status: Home / Self Care | Payer: Non-veteran care | Source: Ambulatory Visit | Attending: Vascular Surgery | Admitting: Vascular Surgery

## 2017-05-14 VITALS — BP 121/88 | HR 101 | Temp 96.9°F | Resp 16 | Ht 68.5 in | Wt 157.0 lb

## 2017-05-14 DIAGNOSIS — I6523 Occlusion and stenosis of bilateral carotid arteries: Secondary | ICD-10-CM

## 2017-05-14 LAB — VAS US CAROTID
LCCAPSYS: 89 cm/s
LEFT ECA DIAS: -22 cm/s
LICAPSYS: -93 cm/s
Left CCA dist dias: 42 cm/s
Left CCA dist sys: 84 cm/s
Left CCA prox dias: 35 cm/s
Left ICA dist dias: -58 cm/s
Left ICA dist sys: -108 cm/s
Left ICA prox dias: -46 cm/s
RIGHT CCA MID DIAS: 11 cm/s
RIGHT ECA DIAS: -25 cm/s
Right CCA prox dias: 12 cm/s
Right CCA prox sys: 62 cm/s

## 2017-05-14 NOTE — Progress Notes (Signed)
Patient name: Paula Gomez MRN: [id number] DOB: [birth year] Sex: female  REASON FOR VISIT:   Follow-up of bilateral carotid disease.  HPI:   Paula Gomez is a pleasant 62 y.o. female who has a known right internal carotid artery occlusion.   she had a greater than 80% left carotid stenosis and underwent left carotid endarterectomy on 04/18/2016.  He comes in for a one-year follow-up visit.  I last saw the patient on 11/06/2016.  At that time the left carotid endarterectomy site was widely patent.  Since I saw her last, she denies any history of stroke, TIAs, expressive or receptive aphasia, or amaurosis fugax.  She does describe some pain in her legs bilaterally but this occurs with both walking and with standing.  She denies any history of rest pain.  She is unaware of any previous history of DVT.  She also describes some cramps in her legs.  Past Medical History:  Diagnosis Date  . Arthritis   . Complication of anesthesia    recent reaction to versed and fentyl after colonoscopy swellin in face and lips  . Hepatitis    past diagnosis Hep C,pt states that she is now Clear  . Hypertension   . Pre-diabetes     History reviewed. No pertinent family history.  SOCIAL HISTORY: Social History   Tobacco Use  . Smoking status: Current Every Day Smoker    Packs/day: 0.25    Years: 45.00    Pack years: 11.25  . Smokeless tobacco: Former Neurosurgeon    Types: Chew    Quit date: 57  . Tobacco comment: pt. states that she is quitting,down to about 4 cigarettes a day  Substance Use Topics  . Alcohol use: No    Comment: no longer drinks alochol    Allergies  Allergen Reactions  . Fentanyl Swelling    Swelling of face and lips  . Versed [Midazolam] Swelling    Swelling of face and lips    Current Outpatient Medications  Medication Sig Dispense Refill  . aspirin EC 81 MG tablet Take 81 mg by mouth daily.    . Cholecalciferol (VITAMIN D3) 1000 units CAPS     . EPINEPHrine 0.3  mg/0.3 mL IJ SOAJ injection Inject 0.3 mg into the muscle once. For Bee stings    . hydrochlorothiazide (HYDRODIURIL) 25 MG tablet Take 25 mg by mouth daily.    Marland Kitchen ibuprofen (ADVIL,MOTRIN) 800 MG tablet     . lisinopril (PRINIVIL,ZESTRIL) 10 MG tablet Take 1 tablet by mouth daily.    . pravastatin (PRAVACHOL) 40 MG tablet Take 40 mg by mouth daily.    Marland Kitchen amLODipine (NORVASC) 10 MG tablet Take 1 tablet (10 mg total) by mouth daily. (Patient not taking: Reported on 11/06/2016)    . Cholecalciferol 1000 units tablet Take 1,000 Units by mouth 2 (two) times daily.    . diphenhydrAMINE (BENADRYL) 25 mg capsule Take 25 mg by mouth at bedtime.    . gabapentin (NEURONTIN) 400 MG capsule Take 800 mg by mouth 3 (three) times daily.     Marland Kitchen LIPITOR 80 MG tablet     . oxyCODONE-acetaminophen (PERCOCET/ROXICET) 5-325 MG tablet Take 1-2 tablets by mouth every 4 (four) hours as needed for moderate pain. (Patient not taking: Reported on 11/06/2016) 8 tablet 0   No current facility-administered medications for this visit.     REVIEW OF SYSTEMS:  [X]  denotes positive finding, [ ]  denotes negative finding Cardiac  Comments:  Chest pain or  chest pressure:    Shortness of breath upon exertion:    Short of breath when lying flat:    Irregular heart rhythm:        Vascular    Pain in calf, thigh, or hip brought on by ambulation: x   Pain in feet at night that wakes you up from your sleep:     Blood clot in your veins:    Leg swelling:         Pulmonary    Oxygen at home:    Productive cough:     Wheezing:         Neurologic    Sudden weakness in arms or legs:     Sudden numbness in arms or legs:     Sudden onset of difficulty speaking or slurred speech:    Temporary loss of vision in one eye:     Problems with dizziness:         Gastrointestinal    Blood in stool:     Vomited blood:         Genitourinary    Burning when urinating:     Blood in urine:        Psychiatric    Major depression:          Hematologic    Bleeding problems:    Problems with blood clotting too easily:        Skin    Rashes or ulcers:        Constitutional    Fever or chills:     PHYSICAL EXAM:   Vitals:   05/14/17 1348 05/14/17 1352  BP: 119/86 121/88  Pulse: (!) 102 (!) 101  Resp: 16 16  Temp: (!) 96.9 F (36.1 C) (!) 96.9 F (36.1 C)  TempSrc: Oral Oral  SpO2: 99%   Weight: 157 lb (71.2 kg) 157 lb (71.2 kg)  Height: 5' 8.5" (1.74 m) 5' 8.5" (1.74 m)    GENERAL: The patient is a well-nourished female, in no acute distress. The vital signs are documented above. CARDIAC: There is a regular rate and rhythm.  VASCULAR: I do not detect carotid bruits. She has palpable femoral pulses and palpable dorsalis pedis pulses bilaterally. She has no significant lower extremity swelling. PULMONARY: There is good air exchange bilaterally without wheezing or rales. ABDOMEN: Soft and non-tender with normal pitched bowel sounds.  MUSCULOSKELETAL: There are no major deformities or cyanosis. NEUROLOGIC: No focal weakness or paresthesias are detected. SKIN: There are no ulcers or rashes noted. PSYCHIATRIC: The patient has a normal affect.  DATA:    CAROTID DUPLEX: I have independently interpreted her carotid duplex scan.  She has a known right internal carotid artery occlusion.  Her left carotid endarterectomy site is widely patent without evidence of stenosis.  Both vertebral arteries are patent with antegrade flow.  MEDICAL ISSUES:   STATUS POST LEFT CAROTID ENDARTERECTOMY: This patient underwent a left carotid endarterectomy in February 2018.  She is asymptomatic.  The left carotid endarterectomy site is widely patent.  She is on aspirin and is on a statin.  She does continue to smoke and again we have discussed the importance of tobacco cessation.  I have ordered a follow-up carotid duplex scan in 1 year and I will see her back at that time.  She knows to call sooner if she has problems.  LEG PAIN: She  has some cramps in both legs and we have discussed the importance of hydration and potential electrolyte imbalances.  I reassured her however that she had no evidence of arterial insufficiency with palpable pedal pulses.  She does not have any significant swelling to suggest venous disease.  Waverly Ferrarihristopher Rosaline Ezekiel [facility] Beeper [phone]

## 2017-11-19 DIAGNOSIS — M461 Sacroiliitis, not elsewhere classified: Secondary | ICD-10-CM | POA: Insufficient documentation

## 2017-11-19 DIAGNOSIS — M48061 Spinal stenosis, lumbar region without neurogenic claudication: Secondary | ICD-10-CM | POA: Insufficient documentation

## 2017-11-19 DIAGNOSIS — G8929 Other chronic pain: Secondary | ICD-10-CM | POA: Insufficient documentation

## 2020-09-13 ENCOUNTER — Other Ambulatory Visit: Payer: Self-pay

## 2020-09-13 DIAGNOSIS — I6522 Occlusion and stenosis of left carotid artery: Secondary | ICD-10-CM

## 2020-09-20 ENCOUNTER — Ambulatory Visit (HOSPITAL_COMMUNITY)
Admission: RE | Admit: 2020-09-20 | Discharge: 2020-09-20 | Disposition: A | Discharge status: Home / Self Care | Payer: No Typology Code available for payment source | Source: Ambulatory Visit | Attending: Vascular Surgery | Admitting: Vascular Surgery

## 2020-09-20 ENCOUNTER — Encounter: Payer: Self-pay | Admitting: Vascular Surgery

## 2020-09-20 ENCOUNTER — Other Ambulatory Visit: Payer: Self-pay

## 2020-09-20 ENCOUNTER — Ambulatory Visit (INDEPENDENT_AMBULATORY_CARE_PROVIDER_SITE_OTHER): Payer: No Typology Code available for payment source | Admitting: Vascular Surgery

## 2020-09-20 VITALS — BP 115/86 | HR 84 | Temp 97.8°F | Resp 20 | Ht 68.5 in | Wt 121.0 lb

## 2020-09-20 DIAGNOSIS — I6522 Occlusion and stenosis of left carotid artery: Secondary | ICD-10-CM

## 2020-09-20 NOTE — Progress Notes (Signed)
REASON FOR VISIT:   Follow-up of carotid disease  MEDICAL ISSUES:   S/P LEFT CAROTID ENDARTERECTOMY: The patient is doing well status post left carotid endarterectomy.  The left carotid endarterectomy site is widely patent.  She has a known right ICA occlusion.  She will need continued yearly follow-up duplex scans.  I have ordered that for next year and she will follow-up on the PA schedule in 1 year.  She knows to continue her aspirin and statin.  We have also again discussed importance of tobacco cessation.  She knows to call sooner if she has problems.  HPI:   Paula Gomez is a pleasant 65 y.o. female who underwent a left carotid endarterectomy for an asymptomatic greater than 80% left carotid stenosis in February 2018.  She has a known right internal carotid artery occlusion.  Given the contralateral occlusion were following this yearly.  She comes in for a yearly follow-up visit.  Since I saw her last, she denies any history of stroke, TIAs, expressive or receptive aphasia, or amaurosis fugax.  She is on aspirin and is on a statin.  She continues to smoke but is cut back from 2 packs/day to 1 pack/day.  She is continuing to work on this.  Past Medical History:  Diagnosis Date   Arthritis    Complication of anesthesia    recent reaction to versed and fentyl after colonoscopy swellin in face and lips   Hepatitis    past diagnosis Hep C,pt states that she is now Clear   Hypertension    Pre-diabetes     History reviewed. No pertinent family history.  SOCIAL HISTORY: Social History   Tobacco Use   Smoking status: Every Day    Packs/day: 0.25    Years: 45.00    Pack years: 11.25    Types: Cigarettes   Smokeless tobacco: Former    Types: Chew    Quit date: 1975   Tobacco comments:    pt. states that she is quitting,down to about 4 cigarettes a day  Substance Use Topics   Alcohol use: No    Comment: no longer drinks alochol    Allergies  Allergen Reactions    Bee Venom Swelling   Fentanyl Swelling    Swelling of face and lips   Versed [Midazolam] Swelling    Swelling of face and lips    Current Outpatient Medications  Medication Sig Dispense Refill   acetaminophen (TYLENOL) 325 MG tablet Take by mouth.     acyclovir (ZOVIRAX) 800 MG tablet Take by mouth.     aspirin EC 81 MG tablet Take 81 mg by mouth daily.     Cholecalciferol (VITAMIN D3) 1000 units CAPS      Cholecalciferol 1000 units tablet Take 1,000 Units by mouth 2 (two) times daily.     diphenhydrAMINE (BENADRYL) 25 mg capsule Take 25 mg by mouth at bedtime.     EPINEPHrine 0.3 mg/0.3 mL IJ SOAJ injection Inject 0.3 mg into the muscle once. For Bee stings     ibuprofen (ADVIL,MOTRIN) 800 MG tablet      LIPITOR 80 MG tablet      lisinopril (PRINIVIL,ZESTRIL) 10 MG tablet Take 1 tablet by mouth daily.     Multiple Vitamin (MULTIVITAMINS PO) Take by mouth.     amLODipine (NORVASC) 10 MG tablet Take 1 tablet (10 mg total) by mouth daily. (Patient not taking: No sig reported)     gabapentin (NEURONTIN) 400 MG capsule Take 800  mg by mouth 3 (three) times daily.  (Patient not taking: Reported on 09/20/2020)     hydrochlorothiazide (HYDRODIURIL) 25 MG tablet Take 25 mg by mouth daily. (Patient not taking: Reported on 09/20/2020)     pravastatin (PRAVACHOL) 40 MG tablet Take 40 mg by mouth daily. (Patient not taking: Reported on 09/20/2020)     No current facility-administered medications for this visit.    REVIEW OF SYSTEMS:  [X]  denotes positive finding, [ ]  denotes negative finding Cardiac  Comments:  Chest pain or chest pressure:    Shortness of breath upon exertion:    Short of breath when lying flat:    Irregular heart rhythm:        Vascular    Pain in calf, thigh, or hip brought on by ambulation: x   Pain in feet at night that wakes you up from your sleep:     Blood clot in your veins:    Leg swelling:         Pulmonary    Oxygen at home:    Productive cough:     Wheezing:          Neurologic    Sudden weakness in arms or legs:     Sudden numbness in arms or legs:     Sudden onset of difficulty speaking or slurred speech:    Temporary loss of vision in one eye:     Problems with dizziness:         Gastrointestinal    Blood in stool:     Vomited blood:         Genitourinary    Burning when urinating:     Blood in urine:        Psychiatric    Major depression:         Hematologic    Bleeding problems:    Problems with blood clotting too easily:        Skin    Rashes or ulcers:        Constitutional    Fever or chills:     PHYSICAL EXAM:   Vitals:   09/20/20 1609 09/20/20 1611  BP: 136/85 115/86  Pulse: 84   Resp: 20   Temp: 97.8 F (36.6 C)   SpO2: 98%   Weight: 121 lb (54.9 kg)   Height: 5' 8.5" (1.74 m)     GENERAL: The patient is a well-nourished female, in no acute distress. The vital signs are documented above. CARDIAC: There is a regular rate and rhythm.  VASCULAR: I do not detect carotid bruits. She has palpable dorsalis pedis pulses. PULMONARY: There is good air exchange bilaterally without wheezing or rales. ABDOMEN: Soft and non-tender with normal pitched bowel sounds.  MUSCULOSKELETAL: There are no major deformities or cyanosis. NEUROLOGIC: No focal weakness or paresthesias are detected. SKIN: There are no ulcers or rashes noted. PSYCHIATRIC: The patient has a normal affect.  DATA:    CAROTID DUPLEX: I have independently interpreted her carotid duplex scan.  On the right side she has a known right internal carotid artery occlusion.  The right vertebral artery is patent with antegrade flow.  On the left side there is a less than 39% stenosis.  The left vertebral artery is patent with antegrade flow.  11/21/20 [facility] [phone]

## 2021-02-28 ENCOUNTER — Encounter (HOSPITAL_COMMUNITY): Payer: Self-pay | Admitting: *Deleted

## 2021-02-28 ENCOUNTER — Emergency Department (HOSPITAL_COMMUNITY): Payer: No Typology Code available for payment source

## 2021-02-28 ENCOUNTER — Emergency Department (HOSPITAL_COMMUNITY)
Admission: EM | Admit: 2021-02-28 | Discharge: 2021-02-28 | Disposition: A | Discharge status: Home / Self Care | Payer: No Typology Code available for payment source | Attending: Emergency Medicine | Admitting: Emergency Medicine

## 2021-02-28 ENCOUNTER — Other Ambulatory Visit: Payer: Self-pay

## 2021-02-28 DIAGNOSIS — I10 Essential (primary) hypertension: Secondary | ICD-10-CM | POA: Insufficient documentation

## 2021-02-28 DIAGNOSIS — Y908 Blood alcohol level of 240 mg/100 ml or more: Secondary | ICD-10-CM | POA: Diagnosis not present

## 2021-02-28 DIAGNOSIS — W1839XA Other fall on same level, initial encounter: Secondary | ICD-10-CM | POA: Diagnosis not present

## 2021-02-28 DIAGNOSIS — S0181XA Laceration without foreign body of other part of head, initial encounter: Secondary | ICD-10-CM | POA: Diagnosis not present

## 2021-02-28 DIAGNOSIS — R000 Tachycardia, unspecified: Secondary | ICD-10-CM | POA: Diagnosis not present

## 2021-02-28 DIAGNOSIS — Z79899 Other long term (current) drug therapy: Secondary | ICD-10-CM | POA: Insufficient documentation

## 2021-02-28 DIAGNOSIS — K829 Disease of gallbladder, unspecified: Secondary | ICD-10-CM | POA: Diagnosis not present

## 2021-02-28 DIAGNOSIS — Z7982 Long term (current) use of aspirin: Secondary | ICD-10-CM | POA: Diagnosis not present

## 2021-02-28 DIAGNOSIS — Y92009 Unspecified place in unspecified non-institutional (private) residence as the place of occurrence of the external cause: Secondary | ICD-10-CM | POA: Diagnosis not present

## 2021-02-28 DIAGNOSIS — F17210 Nicotine dependence, cigarettes, uncomplicated: Secondary | ICD-10-CM | POA: Diagnosis not present

## 2021-02-28 DIAGNOSIS — F10129 Alcohol abuse with intoxication, unspecified: Secondary | ICD-10-CM | POA: Insufficient documentation

## 2021-02-28 DIAGNOSIS — S0990XA Unspecified injury of head, initial encounter: Secondary | ICD-10-CM | POA: Diagnosis present

## 2021-02-28 DIAGNOSIS — F10920 Alcohol use, unspecified with intoxication, uncomplicated: Secondary | ICD-10-CM

## 2021-02-28 DIAGNOSIS — D500 Iron deficiency anemia secondary to blood loss (chronic): Secondary | ICD-10-CM | POA: Diagnosis not present

## 2021-02-28 DIAGNOSIS — W19XXXA Unspecified fall, initial encounter: Secondary | ICD-10-CM

## 2021-02-28 LAB — CBC WITH DIFFERENTIAL/PLATELET
Abs Immature Granulocytes: 0.06 10*3/uL (ref 0.00–0.07)
Basophils Absolute: 0.1 10*3/uL (ref 0.0–0.1)
Basophils Relative: 1 %
Eosinophils Absolute: 0.1 10*3/uL (ref 0.0–0.5)
Eosinophils Relative: 1 %
HCT: 37.8 % (ref 36.0–46.0)
Hemoglobin: 12.5 g/dL (ref 12.0–15.0)
Immature Granulocytes: 1 %
Lymphocytes Relative: 47 %
Lymphs Abs: 4.1 10*3/uL — ABNORMAL HIGH (ref 0.7–4.0)
MCH: 37.2 pg — ABNORMAL HIGH (ref 26.0–34.0)
MCHC: 33.1 g/dL (ref 30.0–36.0)
MCV: 112.5 fL — ABNORMAL HIGH (ref 80.0–100.0)
Monocytes Absolute: 0.6 10*3/uL (ref 0.1–1.0)
Monocytes Relative: 7 %
Neutro Abs: 3.8 10*3/uL (ref 1.7–7.7)
Neutrophils Relative %: 43 %
Platelets: 221 10*3/uL (ref 150–400)
RBC: 3.36 MIL/uL — ABNORMAL LOW (ref 3.87–5.11)
RDW: 13.1 % (ref 11.5–15.5)
WBC: 8.8 10*3/uL (ref 4.0–10.5)
nRBC: 0 % (ref 0.0–0.2)

## 2021-02-28 LAB — COMPREHENSIVE METABOLIC PANEL
ALT: 21 U/L (ref 0–44)
AST: 35 U/L (ref 15–41)
Albumin: 4.1 g/dL (ref 3.5–5.0)
Alkaline Phosphatase: 107 U/L (ref 38–126)
Anion gap: 13 (ref 5–15)
BUN: 12 mg/dL (ref 8–23)
CO2: 16 mmol/L — ABNORMAL LOW (ref 22–32)
Calcium: 8.5 mg/dL — ABNORMAL LOW (ref 8.9–10.3)
Chloride: 110 mmol/L (ref 98–111)
Creatinine, Ser: 0.88 mg/dL (ref 0.44–1.00)
GFR, Estimated: >60 mL/min (ref >60)
Glucose, Bld: 160 mg/dL — ABNORMAL HIGH (ref 70–99)
Potassium: 3.1 mmol/L — ABNORMAL LOW (ref 3.5–5.1)
Sodium: 139 mmol/L (ref 135–145)
Total Bilirubin: 0.2 mg/dL — ABNORMAL LOW (ref 0.3–1.2)
Total Protein: 7.8 g/dL (ref 6.5–8.1)

## 2021-02-28 LAB — PROTIME-INR
INR: 1 (ref 0.8–1.2)
Prothrombin Time: 12.9 seconds (ref 11.4–15.2)

## 2021-02-28 LAB — ETHANOL: Alcohol, Ethyl (B): 352 mg/dL (ref <10)

## 2021-02-28 MED ORDER — IOHEXOL 300 MG/ML  SOLN
100.0000 mL | Freq: Once | INTRAMUSCULAR | Status: AC | PRN
  Administered 2021-02-28: 21:00:00 80 mL via INTRAVENOUS

## 2021-02-28 MED ORDER — LACTATED RINGERS IV BOLUS
1000.0000 mL | Freq: Once | INTRAVENOUS | Status: AC
  Administered 2021-02-28: 20:00:00 1000 mL via INTRAVENOUS

## 2021-02-28 MED ORDER — LIDOCAINE-EPINEPHRINE (PF) 2 %-1:200000 IJ SOLN
10.0000 mL | Freq: Once | INTRAMUSCULAR | Status: AC
  Administered 2021-02-28: 10 mL
  Filled 2021-02-28: qty 20

## 2021-02-28 NOTE — ED Triage Notes (Signed)
Pt found on floor at home by family member, pt states she fell. Pt admits to drinking two beers.

## 2021-02-28 NOTE — ED Notes (Signed)
Pt given chips, rice crispy treat and diet ginger ale and encouraged to eat

## 2021-02-28 NOTE — ED Notes (Signed)
Telfa, abd pad, gauze and ace wrap used to wrap wound on head for blood control; pt cleaned up

## 2021-02-28 NOTE — Discharge Instructions (Addendum)
The stitches can be taken out in about 5 days.
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
## 2021-02-28 NOTE — ED Notes (Signed)
ED Provider at bedside. 

## 2021-02-28 NOTE — ED Notes (Signed)
Gave pt d/c paperwork, verbalized understanding. No further questions.

## 2021-02-28 NOTE — ED Notes (Signed)
Pt removed dressing from head while in CT; dressing reapplied when pt arrived in room

## 2021-03-01 LAB — SAMPLE TO BLOOD BANK

## 2021-06-29 ENCOUNTER — Other Ambulatory Visit: Payer: Self-pay

## 2021-06-29 ENCOUNTER — Emergency Department (HOSPITAL_COMMUNITY): Payer: No Typology Code available for payment source

## 2021-06-29 ENCOUNTER — Emergency Department (HOSPITAL_COMMUNITY)
Admission: EM | Admit: 2021-06-29 | Discharge: 2021-06-29 | Disposition: A | Discharge status: Home / Self Care | Payer: No Typology Code available for payment source | Attending: Emergency Medicine | Admitting: Emergency Medicine

## 2021-06-29 ENCOUNTER — Encounter (HOSPITAL_COMMUNITY): Payer: Self-pay | Admitting: Emergency Medicine

## 2021-06-29 DIAGNOSIS — F10129 Alcohol abuse with intoxication, unspecified: Secondary | ICD-10-CM | POA: Diagnosis not present

## 2021-06-29 DIAGNOSIS — S20211A Contusion of right front wall of thorax, initial encounter: Secondary | ICD-10-CM | POA: Diagnosis not present

## 2021-06-29 DIAGNOSIS — W19XXXA Unspecified fall, initial encounter: Secondary | ICD-10-CM | POA: Insufficient documentation

## 2021-06-29 DIAGNOSIS — Z79899 Other long term (current) drug therapy: Secondary | ICD-10-CM | POA: Diagnosis not present

## 2021-06-29 DIAGNOSIS — S29001A Unspecified injury of muscle and tendon of front wall of thorax, initial encounter: Secondary | ICD-10-CM | POA: Diagnosis present

## 2021-06-29 DIAGNOSIS — Z7982 Long term (current) use of aspirin: Secondary | ICD-10-CM | POA: Insufficient documentation

## 2021-06-29 DIAGNOSIS — F10920 Alcohol use, unspecified with intoxication, uncomplicated: Secondary | ICD-10-CM

## 2021-06-29 DIAGNOSIS — M25551 Pain in right hip: Secondary | ICD-10-CM | POA: Diagnosis not present

## 2021-06-29 DIAGNOSIS — S0990XA Unspecified injury of head, initial encounter: Secondary | ICD-10-CM | POA: Insufficient documentation

## 2021-06-29 DIAGNOSIS — Y908 Blood alcohol level of 240 mg/100 ml or more: Secondary | ICD-10-CM | POA: Insufficient documentation

## 2021-06-29 DIAGNOSIS — T07XXXA Unspecified multiple injuries, initial encounter: Secondary | ICD-10-CM

## 2021-06-29 DIAGNOSIS — F1020 Alcohol dependence, uncomplicated: Secondary | ICD-10-CM

## 2021-06-29 LAB — RAPID URINE DRUG SCREEN, HOSP PERFORMED
Amphetamines: NOT DETECTED
Barbiturates: NOT DETECTED
Benzodiazepines: NOT DETECTED
Cocaine: NOT DETECTED
Opiates: NOT DETECTED
Tetrahydrocannabinol: POSITIVE — AB

## 2021-06-29 LAB — URINALYSIS, ROUTINE W REFLEX MICROSCOPIC
Bilirubin Urine: NEGATIVE
Glucose, UA: NEGATIVE mg/dL
Hgb urine dipstick: NEGATIVE
Ketones, ur: NEGATIVE mg/dL
Leukocytes,Ua: NEGATIVE
Nitrite: POSITIVE — AB
Protein, ur: NEGATIVE mg/dL
Specific Gravity, Urine: 1.008 (ref 1.005–1.030)
pH: 5 (ref 5.0–8.0)

## 2021-06-29 LAB — CBC WITH DIFFERENTIAL/PLATELET
Abs Immature Granulocytes: 0.05 10*3/uL (ref 0.00–0.07)
Basophils Absolute: 0.1 10*3/uL (ref 0.0–0.1)
Basophils Relative: 1 %
Eosinophils Absolute: 0 10*3/uL (ref 0.0–0.5)
Eosinophils Relative: 1 %
HCT: 34.1 % — ABNORMAL LOW (ref 36.0–46.0)
Hemoglobin: 11.7 g/dL — ABNORMAL LOW (ref 12.0–15.0)
Immature Granulocytes: 1 %
Lymphocytes Relative: 26 %
Lymphs Abs: 2.1 10*3/uL (ref 0.7–4.0)
MCH: 36.8 pg — ABNORMAL HIGH (ref 26.0–34.0)
MCHC: 34.3 g/dL (ref 30.0–36.0)
MCV: 107.2 fL — ABNORMAL HIGH (ref 80.0–100.0)
Monocytes Absolute: 0.8 10*3/uL (ref 0.1–1.0)
Monocytes Relative: 9 %
Neutro Abs: 5.3 10*3/uL (ref 1.7–7.7)
Neutrophils Relative %: 62 %
Platelets: 221 10*3/uL (ref 150–400)
RBC: 3.18 MIL/uL — ABNORMAL LOW (ref 3.87–5.11)
RDW: 14.6 % (ref 11.5–15.5)
WBC: 8.4 10*3/uL (ref 4.0–10.5)
nRBC: 0 % (ref 0.0–0.2)

## 2021-06-29 LAB — BASIC METABOLIC PANEL
Anion gap: 10 (ref 5–15)
BUN: 8 mg/dL (ref 8–23)
CO2: 18 mmol/L — ABNORMAL LOW (ref 22–32)
Calcium: 9.2 mg/dL (ref 8.9–10.3)
Chloride: 113 mmol/L — ABNORMAL HIGH (ref 98–111)
Creatinine, Ser: 0.58 mg/dL (ref 0.44–1.00)
GFR, Estimated: >60 mL/min (ref >60)
Glucose, Bld: 95 mg/dL (ref 70–99)
Potassium: 3.8 mmol/L (ref 3.5–5.1)
Sodium: 141 mmol/L (ref 135–145)

## 2021-06-29 LAB — ETHANOL: Alcohol, Ethyl (B): 361 mg/dL (ref <10)

## 2021-06-29 MED ORDER — SODIUM CHLORIDE 0.9 % IV BOLUS
1000.0000 mL | Freq: Once | INTRAVENOUS | Status: AC
  Administered 2021-06-29: 1000 mL via INTRAVENOUS

## 2021-06-29 NOTE — ED Triage Notes (Signed)
Pt to the ED with [facility] after a fall with right hip pain. ? ?Pt has ETOH on board and states she has had 2 fifths of liquor today. ? ?Bruising and swelling noted to right hip area. ? ? ?

## 2021-06-29 NOTE — Discharge Instructions (Signed)
Avoid drinking alcohol of all types.  Use Tylenol as needed for pain.  Rest is much as possible.  For the next several days use ice on sore spots 3-4 times a day.  After that heat can help.  Call your doctor for follow-up appointment to discuss ongoing symptoms, and treatment of alcoholism. ?

## 2021-06-29 NOTE — ED Provider Notes (Signed)
?[facility] ?Provider Note ? ? ?CSN: [id number] ?Arrival date & time: 06/29/21  1502 ? ?  ? ?History ? ?Chief Complaint  ?Patient presents with  ? Fall  ? ? ?Paula Gomez is a 66 y.o. female. ? ?HPI ? ?Patient with medical history including prediabetic, hepatitis, hyperlipidemia, alcohol dependency present after a fall.  Patient is a poor historian appears to be intoxicated HPI is limited.  She endorses that she fell today, states she fell onto her right side, does not know why or how she fell, she endorses right hip pain does not Dors any other pain denies pain in her legs abdomen chest arms neck back or head no headaches no change in vision no numbness or ting arms or legs.  She states that she drank a couple beers but cannot tell me how much, she denies any other illicit drug use. ? ?Reviewed patient imaging has had bilateral carotid disease underwent revascularization, has been seen in the past for alcohol intoxication most recently was December 14 of last year. ? ?Family members have appeared at bedside, they states that patient lives home alone, they are unaware of how much patient drinks, did not witness the fall, they have no other comments. ? ?Home Medications ?Prior to Admission medications   ?Medication Sig Start Date End Date Taking? Authorizing Provider  ?acetaminophen (TYLENOL) 325 MG tablet Take 650 mg by mouth every 4 (four) hours as needed for moderate pain. 08/05/18  Yes [provider]  ?amLODipine (NORVASC) 5 MG tablet Take 5 mg by mouth daily. 03/26/21  Yes [provider]  ?aspirin EC 81 MG tablet Take 81 mg by mouth daily.   Yes [provider]  ?diphenhydrAMINE (BENADRYL) 25 mg capsule Take 25 mg by mouth at bedtime.   Yes [provider]  ?EPINEPHrine 0.3 mg/0.3 mL IJ SOAJ injection Inject 0.3 mg into the muscle once. For Bee stings   Yes [provider]  ?ferrous gluconate (FERGON) 324 MG tablet Take 324 mg by mouth 2 (two)  times daily with a meal. 05/02/21  Yes [provider]  ?Multiple Vitamin (MULTIVITAMINS PO) Take by mouth. 03/02/19  Yes [provider]  ?pravastatin (PRAVACHOL) 40 MG tablet Take 40 mg by mouth daily.   Yes [provider]  ?amLODipine (NORVASC) 10 MG tablet Take 1 tablet (10 mg total) by mouth daily. ?Patient not taking: Reported on 06/29/2021 04/20/16   Raymond Gurney, PA-C  ?   ? ?Allergies    ?Bee venom, Fentanyl, and Versed [midazolam]   ? ?Review of Systems   ?Review of Systems  ?Unable to perform ROS: Mental status change  ? ?Physical Exam ?Updated Vital Signs ?BP 117/78   Pulse 98   Temp 97.9 ?F (36.6 ?C) (Oral)   Resp 15   Ht 5' 8.5" (1.74 m)   Wt 54.9 kg   SpO2 97%   BMI 18.14 kg/m?  ?Physical Exam ?Vitals and nursing note reviewed.  ?Constitutional:   ?   General: She is not in acute distress. ?   Appearance: She is not ill-appearing.  ?HENT:  ?   Head: Normocephalic and atraumatic.  ?   Comments: No deformity of the head present, no raccoon eyes or battle sign noted, head nontender my exam. ?   Nose: No congestion.  ?   Mouth/Throat:  ?   Mouth: Mucous membranes are moist.  ?   Pharynx: Oropharynx is clear.  ?   Comments: No trismus no  torticollis no oral trauma present. ?Eyes:  ?   Extraocular Movements: Extraocular movements intact.  ?   Conjunctiva/sclera: Conjunctivae normal.  ?   Pupils: Pupils are equal, round, and reactive to light.  ?Cardiovascular:  ?   Rate and Rhythm: Normal rate and regular rhythm.  ?   Pulses: Normal pulses.  ?   Heart sounds: No murmur heard. ?  No friction rub. No gallop.  ?Pulmonary:  ?   Effort: No respiratory distress.  ?   Breath sounds: No wheezing, rhonchi or rales.  ?Abdominal:  ?   Palpations: Abdomen is soft.  ?   Tenderness: There is no abdominal tenderness. There is no right CVA tenderness or left CVA tenderness.  ?Musculoskeletal:  ?   Comments: Patient no tenderness at her right trochanter, there is no leg shortening no  internal or external rotation present, no pelvis instability, legs were palpated they are nontender to palpation, upper extremities were palpated and nontender palpation she has 2+ dorsal pedal pulse as well as 2+ radial pulses.  Spine is nontender on my exam.  ?Skin: ?   General: Skin is warm and dry.  ?   Comments: Patient is noted ecchymosis beneath her right axilla, along ribs 3 and 4, slightly tender to palpation no crepitus or deformities noted.  She also has ecchymosis around the right hip, no cuts or deformities noted.  ?Neurological:  ?   Mental Status: She is alert.  ?   Comments: No focal deficits present, no difficulty word finding, following two-step commands, no lower weakness present.  She is alert and orient x3 does not know what day it is.  ?Psychiatric:     ?   Mood and Affect: Mood normal.  ? ? ?ED Results / Procedures / Treatments   ?Labs ?(all labs ordered are listed, but only abnormal results are displayed) ?Labs Reviewed  ?BASIC METABOLIC PANEL - Abnormal; Notable for the following components:  ?    Result Value  ? Chloride 113 (*)   ? CO2 18 (*)   ? All other components within normal limits  ?CBC WITH DIFFERENTIAL/PLATELET - Abnormal; Notable for the following components:  ? RBC 3.18 (*)   ? Hemoglobin 11.7 (*)   ? HCT 34.1 (*)   ? MCV 107.2 (*)   ? MCH 36.8 (*)   ? All other components within normal limits  ?ETHANOL - Abnormal; Notable for the following components:  ? Alcohol, Ethyl (B) 361 (*)   ? All other components within normal limits  ?URINALYSIS, ROUTINE W REFLEX MICROSCOPIC  ?RAPID URINE DRUG SCREEN, HOSP PERFORMED  ? ? ?EKG ?None ? ?Radiology ?DG Ribs Unilateral W/Chest Right ? ?Result Date: 06/29/2021 ?CLINICAL DATA:  Right mid axillary chest wall pain after fall EXAM: RIGHT RIBS AND CHEST - 3+ VIEW COMPARISON:  02/28/2021 FINDINGS: Frontal view of the chest as well as frontal and oblique views of the right thoracic cage are obtained. Cardiac silhouette is unremarkable. No airspace  disease, effusion, or pneumothorax. There are no acute displaced rib fractures. Remaining bony structures are unremarkable. IMPRESSION: 1. No acute intrathoracic process.  No displaced rib fracture. Electronically Signed   By: Sharlet Salina M.D.   On: 06/29/2021 18:54  ? ?CT Head Wo Contrast ? ?Result Date: 06/29/2021 ?CLINICAL DATA:  Head trauma Fall EXAM: CT HEAD WITHOUT CONTRAST TECHNIQUE: Contiguous axial images were obtained from the base of the skull through the vertex without intravenous contrast. RADIATION DOSE REDUCTION: This exam was performed according  to the departmental dose-optimization program which includes automated exposure control, adjustment of the mA and/or kV according to patient size and/or use of iterative reconstruction technique. COMPARISON:  02/28/2021 FINDINGS: Brain: No evidence of acute infarction, hemorrhage, hydrocephalus, extra-axial collection or mass lesion/mass effect. Vascular: No hyperdense vessel or unexpected calcification. Skull: Normal. Negative for fracture or focal lesion. Sinuses/Orbits: No acute finding. Other: None. IMPRESSION: No acute intracranial abnormality. Electronically Signed   By: Acquanetta BellingFarhaan  Mir M.D.   On: 06/29/2021 16:15  ? ?CT Cervical Spine Wo Contrast ? ?Result Date: 06/29/2021 ?CLINICAL DATA:  Larey SeatFell, intoxicated, neck trauma EXAM: CT CERVICAL SPINE WITHOUT CONTRAST TECHNIQUE: Multidetector CT imaging of the cervical spine was performed without intravenous contrast. Multiplanar CT image reconstructions were also generated. RADIATION DOSE REDUCTION: This exam was performed according to the departmental dose-optimization program which includes automated exposure control, adjustment of the mA and/or kV according to patient size and/or use of iterative reconstruction technique. COMPARISON:  02/28/2021 and previous FINDINGS: Alignment: 2 mm retrolisthesis C2-3, stable. 1-2 mm anterolisthesis C6-7 stable. Skull base and vertebrae: No fracture or focal bone lesion.  Soft tissues and spinal canal: No prevertebral fluid or swelling. No visible canal hematoma. Bilateral carotid arterial calcifications. Disc levels:  C2-3 moderate narrowing with posterior protrusion. C3-5: Mild di

## 2021-06-30 NOTE — ED Provider Notes (Signed)
?  Face-to-face evaluation ? ? ?History: Patient presents for evaluation after fall.She arrived to the ED after fall, she is intoxicated.  She complains primarily of right hip pain. ? ?Physical exam: Patient examination after a period of observation.  At this time she is alert and conversant.  She acknowledges that she drinks too much alcohol she is comfortable.  No respiratory distress.  Ambulation trial she can bear weight but has pain in her right hip ambulation. ? ?MDM: Evaluation for  ?Chief Complaint  ?Patient presents with  ? Fall  ?  ? ?Fall likely due to balance problems from acute intoxication.  Multiple images reviewed, no evidence for fracture.  Patient is an alcoholic and fell when she was first seen.  Family members are with her and will take her home and keep an eye on it.  Advised symptomatic treatment with Tylenol for pain.  With PCP of choice for problems. ? ?Medical screening examination/treatment/procedure(s) were conducted as a shared visit with non-physician practitioner(s) and myself.  I personally evaluated the patient during the encounter ? ?  ?Mancel Bale, MD ?06/30/21 1119 ? ?

## 2022-01-01 ENCOUNTER — Other Ambulatory Visit: Payer: Self-pay | Admitting: *Deleted

## 2022-01-01 DIAGNOSIS — I6522 Occlusion and stenosis of left carotid artery: Secondary | ICD-10-CM

## 2022-01-16 DIAGNOSIS — E871 Hypo-osmolality and hyponatremia: Secondary | ICD-10-CM | POA: Insufficient documentation

## 2022-01-16 DIAGNOSIS — E611 Iron deficiency: Secondary | ICD-10-CM | POA: Insufficient documentation

## 2022-01-16 DIAGNOSIS — F1220 Cannabis dependence, uncomplicated: Secondary | ICD-10-CM | POA: Insufficient documentation

## 2022-01-16 DIAGNOSIS — M79673 Pain in unspecified foot: Secondary | ICD-10-CM | POA: Insufficient documentation

## 2022-01-16 DIAGNOSIS — S93609A Unspecified sprain of unspecified foot, initial encounter: Secondary | ICD-10-CM | POA: Insufficient documentation

## 2022-01-16 DIAGNOSIS — Z860100 Personal history of colon polyps, unspecified: Secondary | ICD-10-CM | POA: Insufficient documentation

## 2022-01-16 DIAGNOSIS — B009 Herpesviral infection, unspecified: Secondary | ICD-10-CM | POA: Insufficient documentation

## 2022-01-16 DIAGNOSIS — Z801 Family history of malignant neoplasm of trachea, bronchus and lung: Secondary | ICD-10-CM | POA: Insufficient documentation

## 2022-01-16 DIAGNOSIS — K635 Polyp of colon: Secondary | ICD-10-CM | POA: Insufficient documentation

## 2022-01-16 DIAGNOSIS — Z01818 Encounter for other preprocedural examination: Secondary | ICD-10-CM | POA: Insufficient documentation

## 2022-01-16 DIAGNOSIS — M51369 Other intervertebral disc degeneration, lumbar region without mention of lumbar back pain or lower extremity pain: Secondary | ICD-10-CM | POA: Insufficient documentation

## 2022-01-16 DIAGNOSIS — F4389 Other reactions to severe stress: Secondary | ICD-10-CM | POA: Insufficient documentation

## 2022-01-16 DIAGNOSIS — K5730 Diverticulosis of large intestine without perforation or abscess without bleeding: Secondary | ICD-10-CM | POA: Insufficient documentation

## 2022-01-16 DIAGNOSIS — M5136 Other intervertebral disc degeneration, lumbar region: Secondary | ICD-10-CM | POA: Insufficient documentation

## 2022-01-16 DIAGNOSIS — M25559 Pain in unspecified hip: Secondary | ICD-10-CM | POA: Insufficient documentation

## 2022-01-16 DIAGNOSIS — R748 Abnormal levels of other serum enzymes: Secondary | ICD-10-CM | POA: Insufficient documentation

## 2022-01-16 DIAGNOSIS — Z733 Stress, not elsewhere classified: Secondary | ICD-10-CM | POA: Insufficient documentation

## 2022-01-16 DIAGNOSIS — F1290 Cannabis use, unspecified, uncomplicated: Secondary | ICD-10-CM | POA: Insufficient documentation
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
## 2022-01-17 ENCOUNTER — Ambulatory Visit (INDEPENDENT_AMBULATORY_CARE_PROVIDER_SITE_OTHER): Payer: No Typology Code available for payment source | Admitting: Physician Assistant

## 2022-01-17 ENCOUNTER — Ambulatory Visit (HOSPITAL_COMMUNITY)
Admission: RE | Admit: 2022-01-17 | Discharge: 2022-01-17 | Disposition: A | Discharge status: Home / Self Care | Payer: No Typology Code available for payment source | Source: Ambulatory Visit | Attending: Vascular Surgery | Admitting: Vascular Surgery

## 2022-01-17 VITALS — BP 144/90 | HR 111 | Temp 98.1°F | Resp 20 | Ht 68.5 in | Wt 114.2 lb

## 2022-01-17 DIAGNOSIS — I6521 Occlusion and stenosis of right carotid artery: Secondary | ICD-10-CM | POA: Diagnosis not present

## 2022-01-17 DIAGNOSIS — I6522 Occlusion and stenosis of left carotid artery: Secondary | ICD-10-CM | POA: Insufficient documentation

## 2022-01-17 DIAGNOSIS — F17200 Nicotine dependence, unspecified, uncomplicated: Secondary | ICD-10-CM

## 2022-06-24 IMAGING — DX DG RIBS W/ CHEST 3+V*R*
4 series · 4 of 4 positions shown · non-contrast
Comparison: 02/28/2021

CLINICAL DATA: Right mid axillary chest wall pain after fall

EXAM:
RIGHT RIBS AND CHEST - 3+ VIEW

[chest pa]
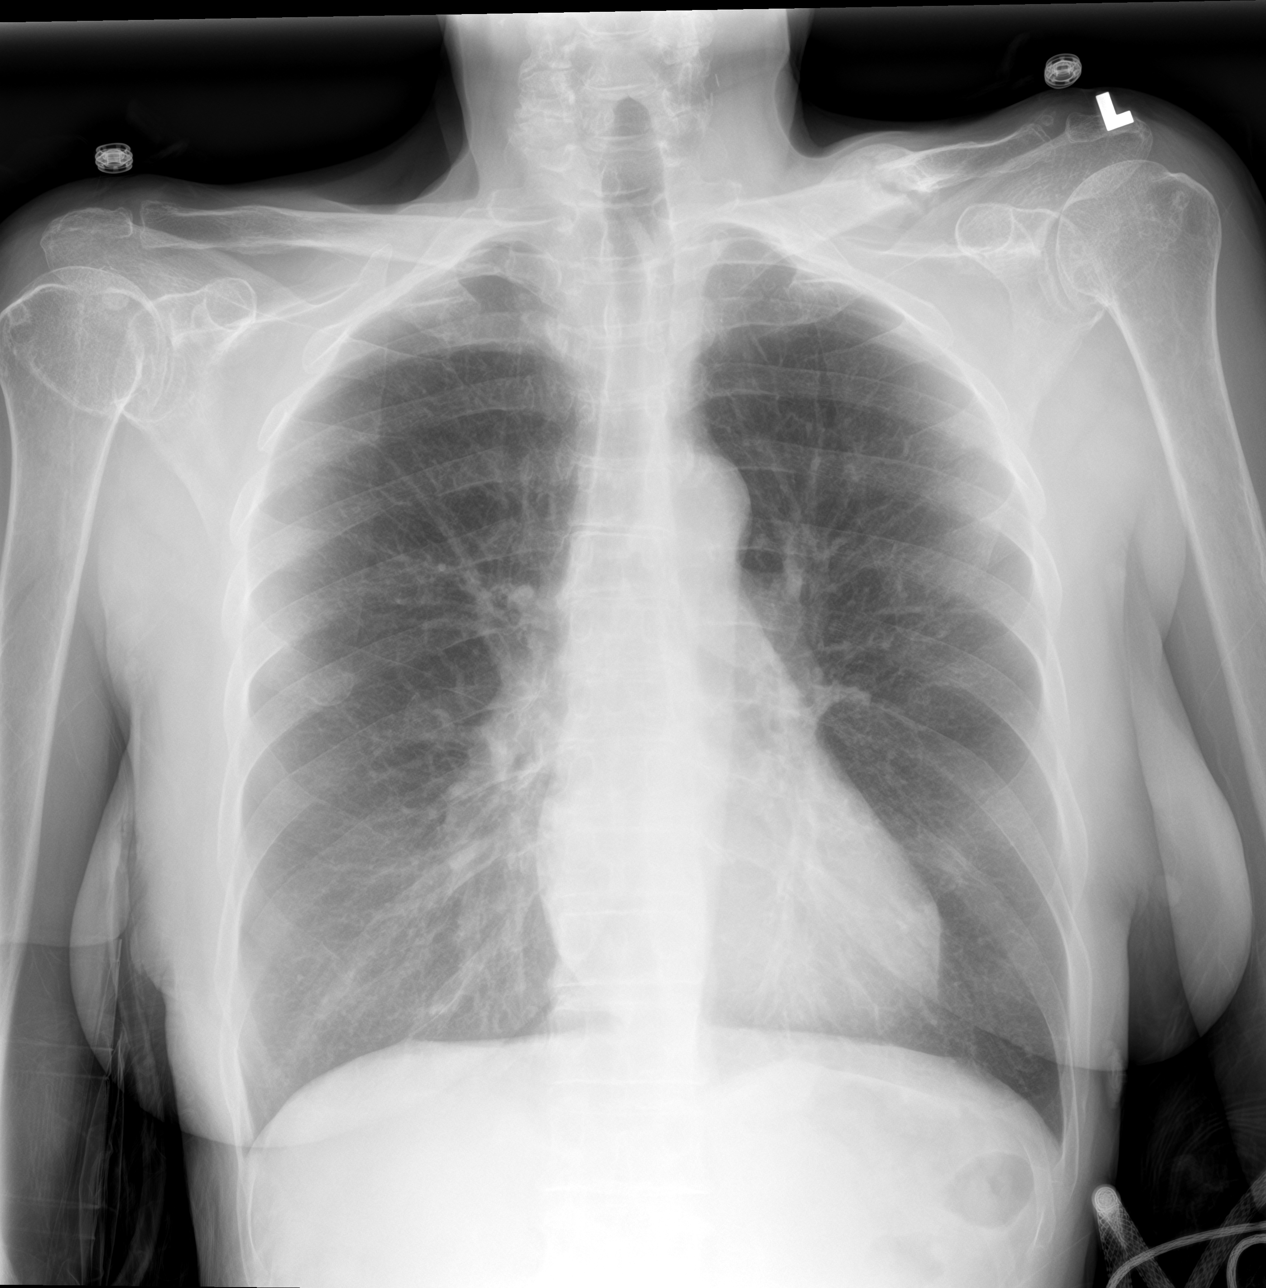

[rib pa]
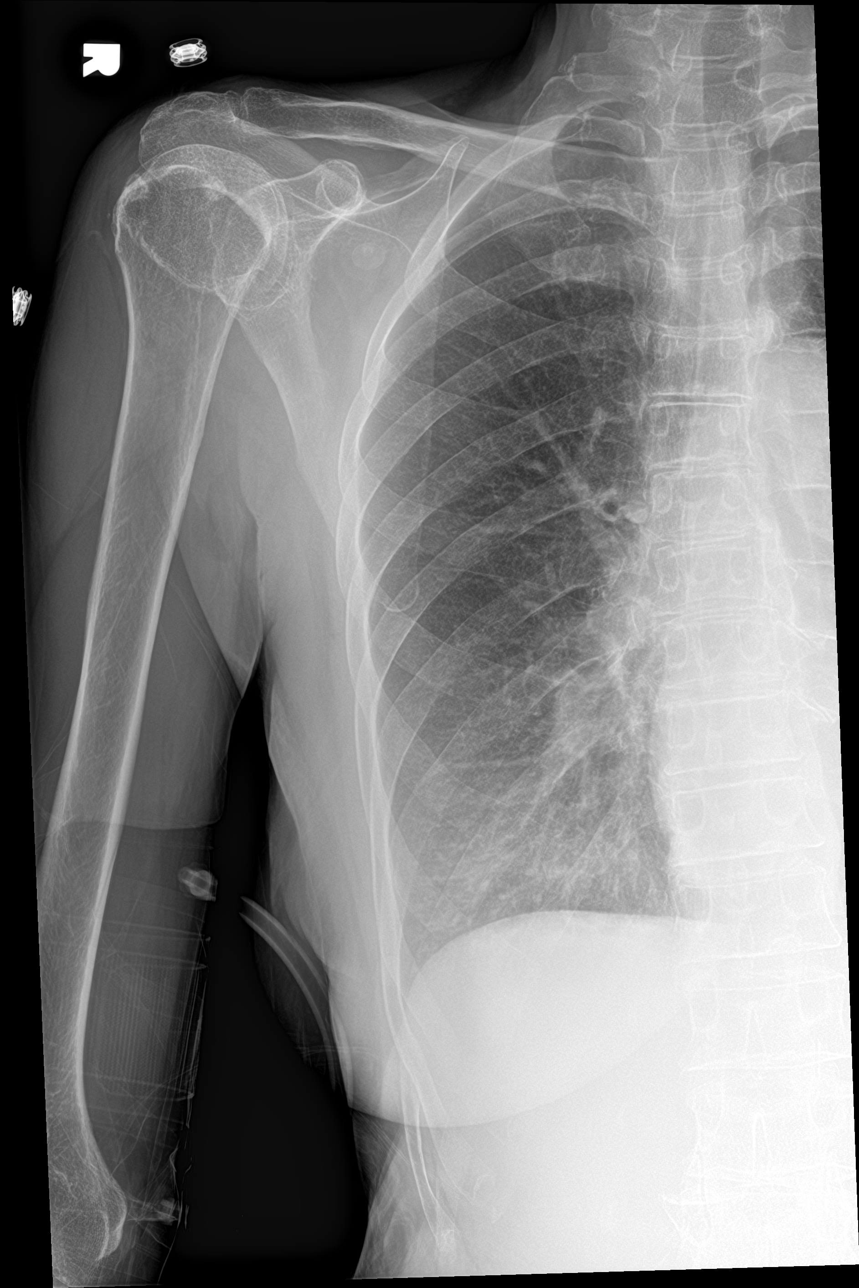

[rib pa obl (1 of 2)]
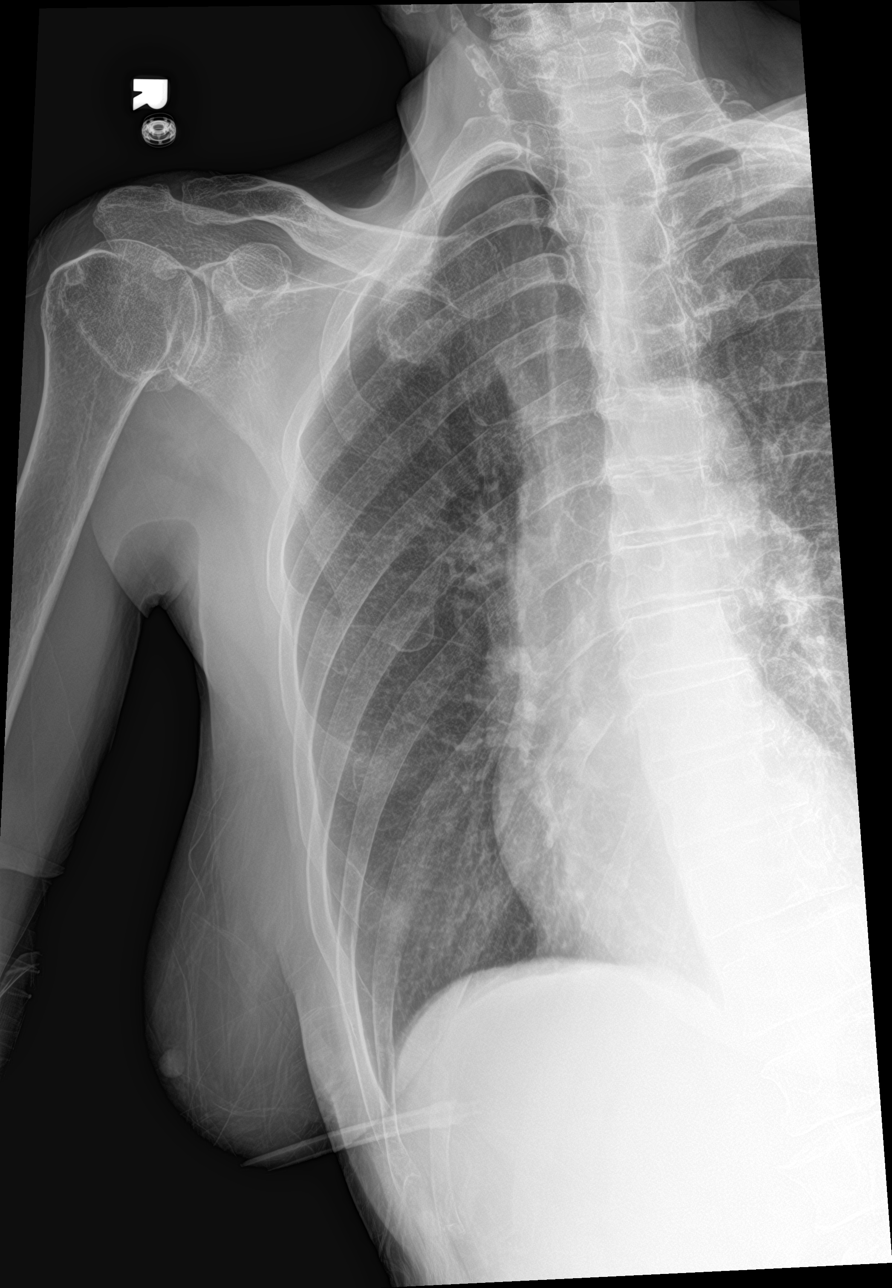

[rib pa obl (2 of 2)]
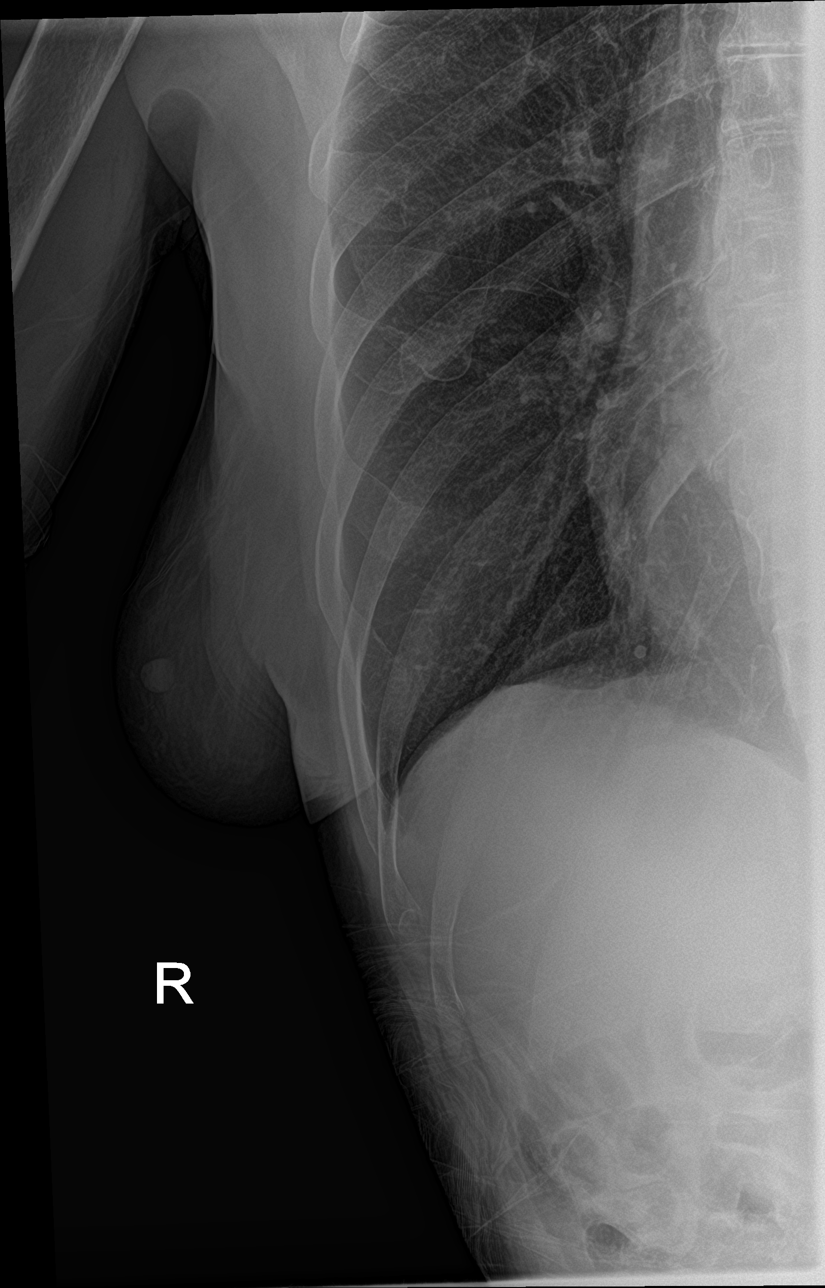

[4 of 4 positions shown; findings below may reference images not displayed]

FINDINGS: Frontal view of the chest as well as frontal and oblique views of
the right thoracic cage are obtained. Cardiac silhouette is
unremarkable. No airspace disease, effusion, or pneumothorax. There
are no acute displaced rib fractures. Remaining bony structures are
unremarkable.
IMPRESSION: 1. No acute intrathoracic process.  No displaced rib fracture.
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
## 2023-01-23 ENCOUNTER — Ambulatory Visit (HOSPITAL_COMMUNITY): Payer: No Typology Code available for payment source

## 2023-01-23 ENCOUNTER — Ambulatory Visit: Payer: No Typology Code available for payment source
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
## 2023-05-02 ENCOUNTER — Ambulatory Visit (INDEPENDENT_AMBULATORY_CARE_PROVIDER_SITE_OTHER): Payer: No Typology Code available for payment source | Admitting: Vascular Surgery

## 2023-05-02 ENCOUNTER — Encounter: Payer: Self-pay | Admitting: Vascular Surgery

## 2023-05-02 VITALS — BP 126/76 | HR 92 | Temp 97.5°F | Resp 18 | Ht 68.5 in | Wt 125.3 lb

## 2023-05-02 DIAGNOSIS — I6523 Occlusion and stenosis of bilateral carotid arteries: Secondary | ICD-10-CM | POA: Diagnosis not present

## 2023-05-09 ENCOUNTER — Other Ambulatory Visit: Payer: Self-pay | Admitting: *Deleted

## 2023-05-09 DIAGNOSIS — I6523 Occlusion and stenosis of bilateral carotid arteries: Secondary | ICD-10-CM

## 2023-05-09 DIAGNOSIS — I6522 Occlusion and stenosis of left carotid artery: Secondary | ICD-10-CM
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
## 2023-06-13 ENCOUNTER — Encounter: Payer: Self-pay | Admitting: Vascular Surgery

## 2023-06-13 ENCOUNTER — Ambulatory Visit (INDEPENDENT_AMBULATORY_CARE_PROVIDER_SITE_OTHER): Admitting: Vascular Surgery

## 2023-06-13 VITALS — BP 131/86 | HR 54 | Temp 98.0°F | Resp 16 | Ht 68.5 in | Wt 122.0 lb

## 2023-06-13 DIAGNOSIS — I723 Aneurysm of iliac artery: Secondary | ICD-10-CM | POA: Diagnosis not present

## 2023-06-16 ENCOUNTER — Other Ambulatory Visit: Payer: Self-pay

## 2023-06-16 DIAGNOSIS — I6521 Occlusion and stenosis of right carotid artery: Secondary | ICD-10-CM

## 2023-10-31 ENCOUNTER — Ambulatory Visit: Payer: No Typology Code available for payment source | Admitting: Vascular Surgery

## 2023-10-31 ENCOUNTER — Encounter (HOSPITAL_COMMUNITY): Payer: No Typology Code available for payment source
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
## 2023-11-07 ENCOUNTER — Ambulatory Visit (HOSPITAL_COMMUNITY)
Admission: RE | Admit: 2023-11-07 | Discharge: 2023-11-07 | Disposition: A | Discharge status: Home / Self Care | Source: Ambulatory Visit | Attending: Vascular Surgery | Admitting: Vascular Surgery

## 2023-11-07 ENCOUNTER — Ambulatory Visit: Attending: Vascular Surgery | Admitting: Vascular Surgery

## 2023-11-07 ENCOUNTER — Encounter: Payer: Self-pay | Admitting: Vascular Surgery

## 2023-11-07 VITALS — BP 134/93 | HR 99 | Temp 98.7°F | Resp 18 | Ht 68.5 in | Wt 122.8 lb

## 2023-11-07 DIAGNOSIS — I6523 Occlusion and stenosis of bilateral carotid arteries: Secondary | ICD-10-CM

## 2023-11-07 DIAGNOSIS — F17200 Nicotine dependence, unspecified, uncomplicated: Secondary | ICD-10-CM | POA: Insufficient documentation

## 2023-11-07 DIAGNOSIS — I10 Essential (primary) hypertension: Secondary | ICD-10-CM | POA: Insufficient documentation

## 2023-11-07 DIAGNOSIS — I6521 Occlusion and stenosis of right carotid artery: Secondary | ICD-10-CM | POA: Diagnosis present

## 2023-11-10 ENCOUNTER — Other Ambulatory Visit: Payer: Self-pay

## 2023-11-10 DIAGNOSIS — I6523 Occlusion and stenosis of bilateral carotid arteries: Secondary | ICD-10-CM

## 2023-11-10 DIAGNOSIS — I723 Aneurysm of iliac artery: Secondary | ICD-10-CM

## 2024-05-14 ENCOUNTER — Ambulatory Visit: Admitting: Vascular Surgery

## 2024-05-14 ENCOUNTER — Encounter (HOSPITAL_COMMUNITY)
